# Patient Record
Sex: Male | Born: 1940 | Race: Black or African American | Hispanic: No | Marital: Married | Smoking: Former smoker
Health system: Southern US, Community
[De-identification: ages and names within clinical notes are randomized; demographics above are authoritative.]

## PROBLEM LIST (undated history)

## (undated) DIAGNOSIS — E7800 Pure hypercholesterolemia, unspecified: Secondary | ICD-10-CM

## (undated) DIAGNOSIS — I10 Essential (primary) hypertension: Secondary | ICD-10-CM

## (undated) HISTORY — PX: CHOLECYSTECTOMY: SHX55

---

## 2010-01-07 ENCOUNTER — Emergency Department (HOSPITAL_BASED_OUTPATIENT_CLINIC_OR_DEPARTMENT_OTHER): Admission: EM | Admit: 2010-01-07 | Discharge: 2010-01-07 | Payer: Self-pay | Admitting: Emergency Medicine

## 2010-05-02 ENCOUNTER — Emergency Department (HOSPITAL_BASED_OUTPATIENT_CLINIC_OR_DEPARTMENT_OTHER): Admission: EM | Admit: 2010-05-02 | Discharge: 2010-05-02 | Payer: Self-pay | Admitting: Emergency Medicine

## 2010-09-21 LAB — URINALYSIS, ROUTINE W REFLEX MICROSCOPIC
Bilirubin Urine: NEGATIVE
Hgb urine dipstick: NEGATIVE
Ketones, ur: NEGATIVE mg/dL
Specific Gravity, Urine: 1.011 (ref 1.005–1.030)
pH: 7 (ref 5.0–8.0)

## 2010-09-21 LAB — BASIC METABOLIC PANEL
BUN: 19 mg/dL (ref 6–23)
CO2: 29 mEq/L (ref 19–32)
GFR calc non Af Amer: 60 mL/min — ABNORMAL LOW (ref >60)
Glucose, Bld: 108 mg/dL — ABNORMAL HIGH (ref 70–99)
Potassium: 3.8 mEq/L (ref 3.5–5.1)
Sodium: 142 mEq/L (ref 135–145)

## 2010-09-25 LAB — BASIC METABOLIC PANEL
BUN: 20 mg/dL (ref 6–23)
CO2: 24 mEq/L (ref 19–32)
Chloride: 107 mEq/L (ref 96–112)
Glucose, Bld: 125 mg/dL — ABNORMAL HIGH (ref 70–99)
Potassium: 3.4 mEq/L — ABNORMAL LOW (ref 3.5–5.1)
Sodium: 139 mEq/L (ref 135–145)

## 2017-07-06 ENCOUNTER — Other Ambulatory Visit: Payer: Self-pay

## 2017-07-06 ENCOUNTER — Emergency Department (HOSPITAL_BASED_OUTPATIENT_CLINIC_OR_DEPARTMENT_OTHER)
Admission: EM | Admit: 2017-07-06 | Discharge: 2017-07-06 | Disposition: A | Discharge status: Home / Self Care | Payer: Medicare Other | Attending: Emergency Medicine | Admitting: Emergency Medicine

## 2017-07-06 ENCOUNTER — Encounter (HOSPITAL_BASED_OUTPATIENT_CLINIC_OR_DEPARTMENT_OTHER): Payer: Self-pay | Admitting: *Deleted

## 2017-07-06 ENCOUNTER — Emergency Department (HOSPITAL_BASED_OUTPATIENT_CLINIC_OR_DEPARTMENT_OTHER): Payer: Medicare Other

## 2017-07-06 DIAGNOSIS — K2900 Acute gastritis without bleeding: Secondary | ICD-10-CM | POA: Insufficient documentation

## 2017-07-06 DIAGNOSIS — R1033 Periumbilical pain: Secondary | ICD-10-CM | POA: Insufficient documentation

## 2017-07-06 DIAGNOSIS — R1013 Epigastric pain: Secondary | ICD-10-CM | POA: Diagnosis present

## 2017-07-06 DIAGNOSIS — Z87891 Personal history of nicotine dependence: Secondary | ICD-10-CM | POA: Diagnosis not present

## 2017-07-06 DIAGNOSIS — J069 Acute upper respiratory infection, unspecified: Secondary | ICD-10-CM

## 2017-07-06 DIAGNOSIS — I10 Essential (primary) hypertension: Secondary | ICD-10-CM | POA: Insufficient documentation

## 2017-07-06 HISTORY — DX: Pure hypercholesterolemia, unspecified: E78.00

## 2017-07-06 HISTORY — DX: Essential (primary) hypertension: I10

## 2017-07-06 LAB — CBC WITH DIFFERENTIAL/PLATELET
Basophils Absolute: 0 10*3/uL (ref 0.0–0.1)
Basophils Relative: 0 %
Eosinophils Absolute: 0 10*3/uL (ref 0.0–0.7)
Eosinophils Relative: 1 %
HEMATOCRIT: 37.5 % — AB (ref 39.0–52.0)
HEMOGLOBIN: 11.8 g/dL — AB (ref 13.0–17.0)
LYMPHS ABS: 1.1 10*3/uL (ref 0.7–4.0)
Lymphocytes Relative: 17 %
MCH: 26.1 pg (ref 26.0–34.0)
MCHC: 31.5 g/dL (ref 30.0–36.0)
MCV: 83 fL (ref 78.0–100.0)
MONOS PCT: 7 %
Monocytes Absolute: 0.4 10*3/uL (ref 0.1–1.0)
NEUTROS ABS: 4.9 10*3/uL (ref 1.7–7.7)
NEUTROS PCT: 75 %
Platelets: 168 10*3/uL (ref 150–400)
RBC: 4.52 MIL/uL (ref 4.22–5.81)
RDW: 15.7 % — ABNORMAL HIGH (ref 11.5–15.5)
WBC: 6.5 10*3/uL (ref 4.0–10.5)

## 2017-07-06 LAB — COMPREHENSIVE METABOLIC PANEL
ALBUMIN: 4.2 g/dL (ref 3.5–5.0)
ALT: 24 U/L (ref 17–63)
ANION GAP: 12 (ref 5–15)
AST: 28 U/L (ref 15–41)
Alkaline Phosphatase: 97 U/L (ref 38–126)
BUN: 19 mg/dL (ref 6–20)
CHLORIDE: 105 mmol/L (ref 101–111)
CO2: 24 mmol/L (ref 22–32)
CREATININE: 1.36 mg/dL — AB (ref 0.61–1.24)
Calcium: 9.1 mg/dL (ref 8.9–10.3)
GFR calc non Af Amer: 49 mL/min — ABNORMAL LOW (ref >60)
GFR, EST AFRICAN AMERICAN: 56 mL/min — AB (ref >60)
GLUCOSE: 103 mg/dL — AB (ref 65–99)
Potassium: 4.4 mmol/L (ref 3.5–5.1)
SODIUM: 141 mmol/L (ref 135–145)
Total Bilirubin: 1 mg/dL (ref 0.3–1.2)
Total Protein: 7.5 g/dL (ref 6.5–8.1)

## 2017-07-06 LAB — URINALYSIS, ROUTINE W REFLEX MICROSCOPIC
BILIRUBIN URINE: NEGATIVE
GLUCOSE, UA: NEGATIVE mg/dL
Hgb urine dipstick: NEGATIVE
Ketones, ur: NEGATIVE mg/dL
Leukocytes, UA: NEGATIVE
NITRITE: NEGATIVE
PH: 8 (ref 5.0–8.0)
Protein, ur: 30 mg/dL — AB
SPECIFIC GRAVITY, URINE: 1.015 (ref 1.005–1.030)

## 2017-07-06 LAB — LIPASE, BLOOD: LIPASE: 37 U/L (ref 11–51)

## 2017-07-06 LAB — URINALYSIS, MICROSCOPIC (REFLEX): RBC / HPF: NONE SEEN RBC/hpf (ref 0–5)

## 2017-07-06 LAB — TROPONIN I: Troponin I: <0.03 ng/mL (ref <0.03)

## 2017-07-06 MED ORDER — ONDANSETRON 4 MG PO TBDP
4.0000 mg | ORAL_TABLET | Freq: Once | ORAL | Status: AC
  Administered 2017-07-06: 4 mg via ORAL
  Filled 2017-07-06: qty 1

## 2017-07-06 MED ORDER — GI COCKTAIL ~~LOC~~
30.0000 mL | Freq: Once | ORAL | Status: AC
  Administered 2017-07-06: 30 mL via ORAL
  Filled 2017-07-06: qty 30

## 2017-07-06 MED ORDER — ONDANSETRON 4 MG PO TBDP: 4.0000 mg | ORAL_TABLET | Freq: Three times a day (TID) | ORAL | 0 refills | Status: DC | PRN

## 2017-07-06 NOTE — ED Triage Notes (Signed)
Abdominal pain since yesterday with radiation into the middle of his lower back.  Hx of sinusitis for 3 days.

## 2017-07-06 NOTE — ED Notes (Signed)
ED Provider at bedside. 

## 2017-07-06 NOTE — Discharge Instructions (Signed)
Recommend taking antacid medicine daily (pantoprazole or other of your choice for 7-10 days)

## 2017-07-06 NOTE — ED Provider Notes (Signed)
[facility] EMERGENCY DEPARTMENT Provider Note   CSN: [id number] Arrival date & time: 07/06/17  1310     History   Chief Complaint Chief Complaint  Patient presents with  . Abdominal Pain    HPI Bradley Zavala is a 76 y.o. male.  HPI  Yesterday afternoon around 4PM had abdominal pain and that increased through to this afternoon Had sinus congestion and sore throat prior, but gradually yesterday developed pain which worsened throughout the day and overnight.   Epigastric pain and had gradual onset of back pain. Back pain was not as severe as pain in abdomen.  Pain feels like a "real pain", not cramping. Was sharp pain.  9/10, stayed right in the middle of his abdomen/epigastrium.  Now pain is 8/10.   Earlier had nausea but no vomiting. No diarrhea or constipation.  No urinary symptoms. No fevers. Tried spray for sore throat.    Past Medical History:  Diagnosis Date  . High cholesterol   . Hypertension     There are no active problems to display for this patient.   Past Surgical History:  Procedure Laterality Date  . CHOLECYSTECTOMY         Home Medications    Prior to Admission medications   Medication Sig Start Date End Date Taking? Authorizing Provider  AMLODIPINE BESYLATE PO Take by mouth.   Yes [provider]  ATORVASTATIN CALCIUM PO Take by mouth.   Yes [provider]  METOPROLOL SUCCINATE PO Take by mouth.   Yes [provider]  ondansetron (ZOFRAN ODT) 4 MG disintegrating tablet Take 1 tablet (4 mg total) by mouth every 8 (eight) hours as needed for nausea or vomiting. 07/06/17   Alvira MondaySchlossman, Goldie Tregoning, MD    Family History No family history on file.  Social History Social History   Tobacco Use  . Smoking status: Former Games developermoker  . Smokeless tobacco: Never Used  Substance Use Topics  . Alcohol use: No    Frequency: Never  . Drug use: No     Allergies   Patient has no known allergies.   Review of  Systems Review of Systems  Constitutional: Negative for fever.  HENT: Negative for sore throat.   Eyes: Negative for visual disturbance.  Respiratory: Negative for shortness of breath.   Cardiovascular: Negative for chest pain.  Gastrointestinal: Positive for abdominal pain and nausea. Negative for constipation, diarrhea and vomiting.  Genitourinary: Negative for difficulty urinating and dysuria.  Musculoskeletal: Negative for back pain and neck stiffness.  Skin: Negative for rash.  Neurological: Negative for syncope and headaches.     Physical Exam Updated Vital Signs BP 140/81 (BP Location: Right Arm)   Pulse 85   Temp 99.5 F (37.5 C) (Oral)   Resp 18   Ht 5\' 8"  (1.727 m)   Wt 68 kg (150 lb)   SpO2 97%   BMI 22.81 kg/m   Physical Exam  Constitutional: He is oriented to person, place, and time. He appears well-developed and well-nourished. No distress.  HENT:  Head: Normocephalic and atraumatic.  Eyes: Conjunctivae and EOM are normal.  Neck: Normal range of motion.  Cardiovascular: Normal rate, regular rhythm, normal heart sounds and intact distal pulses. Exam reveals no gallop and no friction rub.  No murmur heard. Pulmonary/Chest: Effort normal and breath sounds normal. No respiratory distress. He has no wheezes. He has no rales.  Abdominal: Soft. He exhibits no distension. There is tenderness (mild epigastric). There is no guarding, no CVA tenderness  and negative Murphy's sign.  Musculoskeletal: He exhibits no edema.  Neurological: He is alert and oriented to person, place, and time.  Skin: Skin is warm and dry. He is not diaphoretic.  Nursing note and vitals reviewed.    ED Treatments / Results  Labs (all labs ordered are listed, but only abnormal results are displayed) Labs Reviewed  URINALYSIS, ROUTINE W REFLEX MICROSCOPIC - Abnormal; Notable for the following components:      Result Value   Protein, ur 30 (*)    All other components within normal limits    URINALYSIS, MICROSCOPIC (REFLEX) - Abnormal; Notable for the following components:   Bacteria, UA RARE (*)    Squamous Epithelial / LPF 0-5 (*)    All other components within normal limits  COMPREHENSIVE METABOLIC PANEL - Abnormal; Notable for the following components:   Glucose, Bld 103 (*)    Creatinine, Ser 1.36 (*)    GFR calc non Af Amer 49 (*)    GFR calc Af Amer 56 (*)    All other components within normal limits  CBC WITH DIFFERENTIAL/PLATELET - Abnormal; Notable for the following components:   Hemoglobin 11.8 (*)    HCT 37.5 (*)    RDW 15.7 (*)    All other components within normal limits  LIPASE, BLOOD  TROPONIN I    EKG  EKG Interpretation  Date/Time:  Friday July 06 2017 17:45:50 EST Ventricular Rate:  80 PR Interval:    QRS Duration: 91 QT Interval:  396 QTC Calculation: 457 R Axis:   -31 Text Interpretation:  Sinus rhythm Abnormal R-wave progression, late transition Left ventricular hypertrophy No significant change since last tracing Confirmed by Alvira Monday ([id number]) on 07/06/2017 6:33:37 PM       Radiology Dg Chest 2 View  Result Date: 07/06/2017 CLINICAL DATA:  Cough with epigastric pain EXAM: CHEST  2 VIEW COMPARISON:  07/15/2012 FINDINGS: The heart size and mediastinal contours are within normal limits. Both lungs are clear. The visualized skeletal structures are unremarkable. Surgical clips in the right upper quadrant. IMPRESSION: No active cardiopulmonary disease. Electronically Signed   By: Jasmine Pang M.D.   On: 07/06/2017 18:09    Procedures [place] bedside Date/Time: 07/07/2017 2:06 AM Performed by: Alvira Monday, MD Authorized by: Alvira Monday, MD  Consent: Verbal consent obtained. Time out: Immediately prior to procedure a "time out" was called to verify the correct patient, procedure, equipment, support staff and site/side marked as required. Preparation: Patient was prepped and draped in the usual sterile fashion. Local  anesthesia used: no  Anesthesia: Local anesthesia used: no  Sedation: Patient sedated: no  Patient tolerance: Patient tolerated the procedure well with no immediate complications Comments: EMERGENCY DEPARTMENT ULTRASOUND  Study: Limited Retroperitoneal Ultrasound of the Abdominal Aorta.  INDICATIONS: abd pain Multiple views of the abdominal aorta were obtained in real-time from the diaphragmatic hiatus to the aortic bifurcation in transverse planes with a multi-frequency probe.  PERFORMED BY: Myself IMAGES ARCHIVED?: Yes LIMITATIONS:  Bowel gas INTERPRETATION:  No abdominal aortic aneurysm      (including critical care time)  Medications Ordered in ED Medications  ondansetron (ZOFRAN-ODT) disintegrating tablet 4 mg (4 mg Oral Given 07/06/17 1739)  gi cocktail (Maalox,Lidocaine,Donnatal) (30 mLs Oral Given 07/06/17 1739)     Initial Impression / Assessment and Plan / ED Course  I have reviewed the triage vital signs and the nursing notes.  Pertinent labs & imaging results that were available during my care of the patient were  reviewed by me and considered in my medical decision making (see chart for details).     76 year old male with a history of hypertension and hyperlipidemia presents with concern for epigastric abdominal pain.    Patient with stable vital signs after time of observation in the emergency department, bedside ultrasound shows no sign of AAA.  Have low suspicion that symptoms represent AAA.  He has had a history of cholecystectomy, does not have transaminitis, right upper quadrant tenderness to suggest other biliary obstruction.  No sign of pancreatitis on labs.  No sign of urinary tract infection.  History, physical exam, and lab work are not consistent with small bowel obstruction, appendicitis, diverticulitis or nephrolithiasis.  Reports cough, congestion and sore throat.  Chest x-ray shows no sign of pneumonia.  Given epigastric location of pain, EKG,  troponin were done which was showed no significant acute findings.  Suspect patient was likely with a viral URI, with epigastric abdominal pain secondary to gastritis.  His symptoms improved following Zofran and GI cocktail.  Given prescription for Zofran, recommend PPI. Patient discharged in stable condition with understanding of reasons to return.   Final Clinical Impressions(s) / ED Diagnoses   Final diagnoses:  Epigastric pain  Acute gastritis without hemorrhage, unspecified gastritis type  Upper respiratory tract infection, unspecified type    ED Discharge Orders        Ordered    ondansetron (ZOFRAN ODT) 4 MG disintegrating tablet  Every 8 hours PRN     07/06/17 1924       Alvira MondaySchlossman, Damarcus Reggio, MD 07/07/17 [phone]

## 2017-07-06 NOTE — ED Notes (Signed)
Pt verbalizes understanding of d/c instructions and denies any further need at this time. 

## 2017-07-06 NOTE — ED Notes (Signed)
Pt on monitor 

## 2018-11-02 IMAGING — CR DG CHEST 2V
2 series · 2 of 2 positions shown · non-contrast
Comparison: 07/15/2012

CLINICAL DATA: Cough with epigastric pain

EXAM:
CHEST  2 VIEW

[w chest pa]
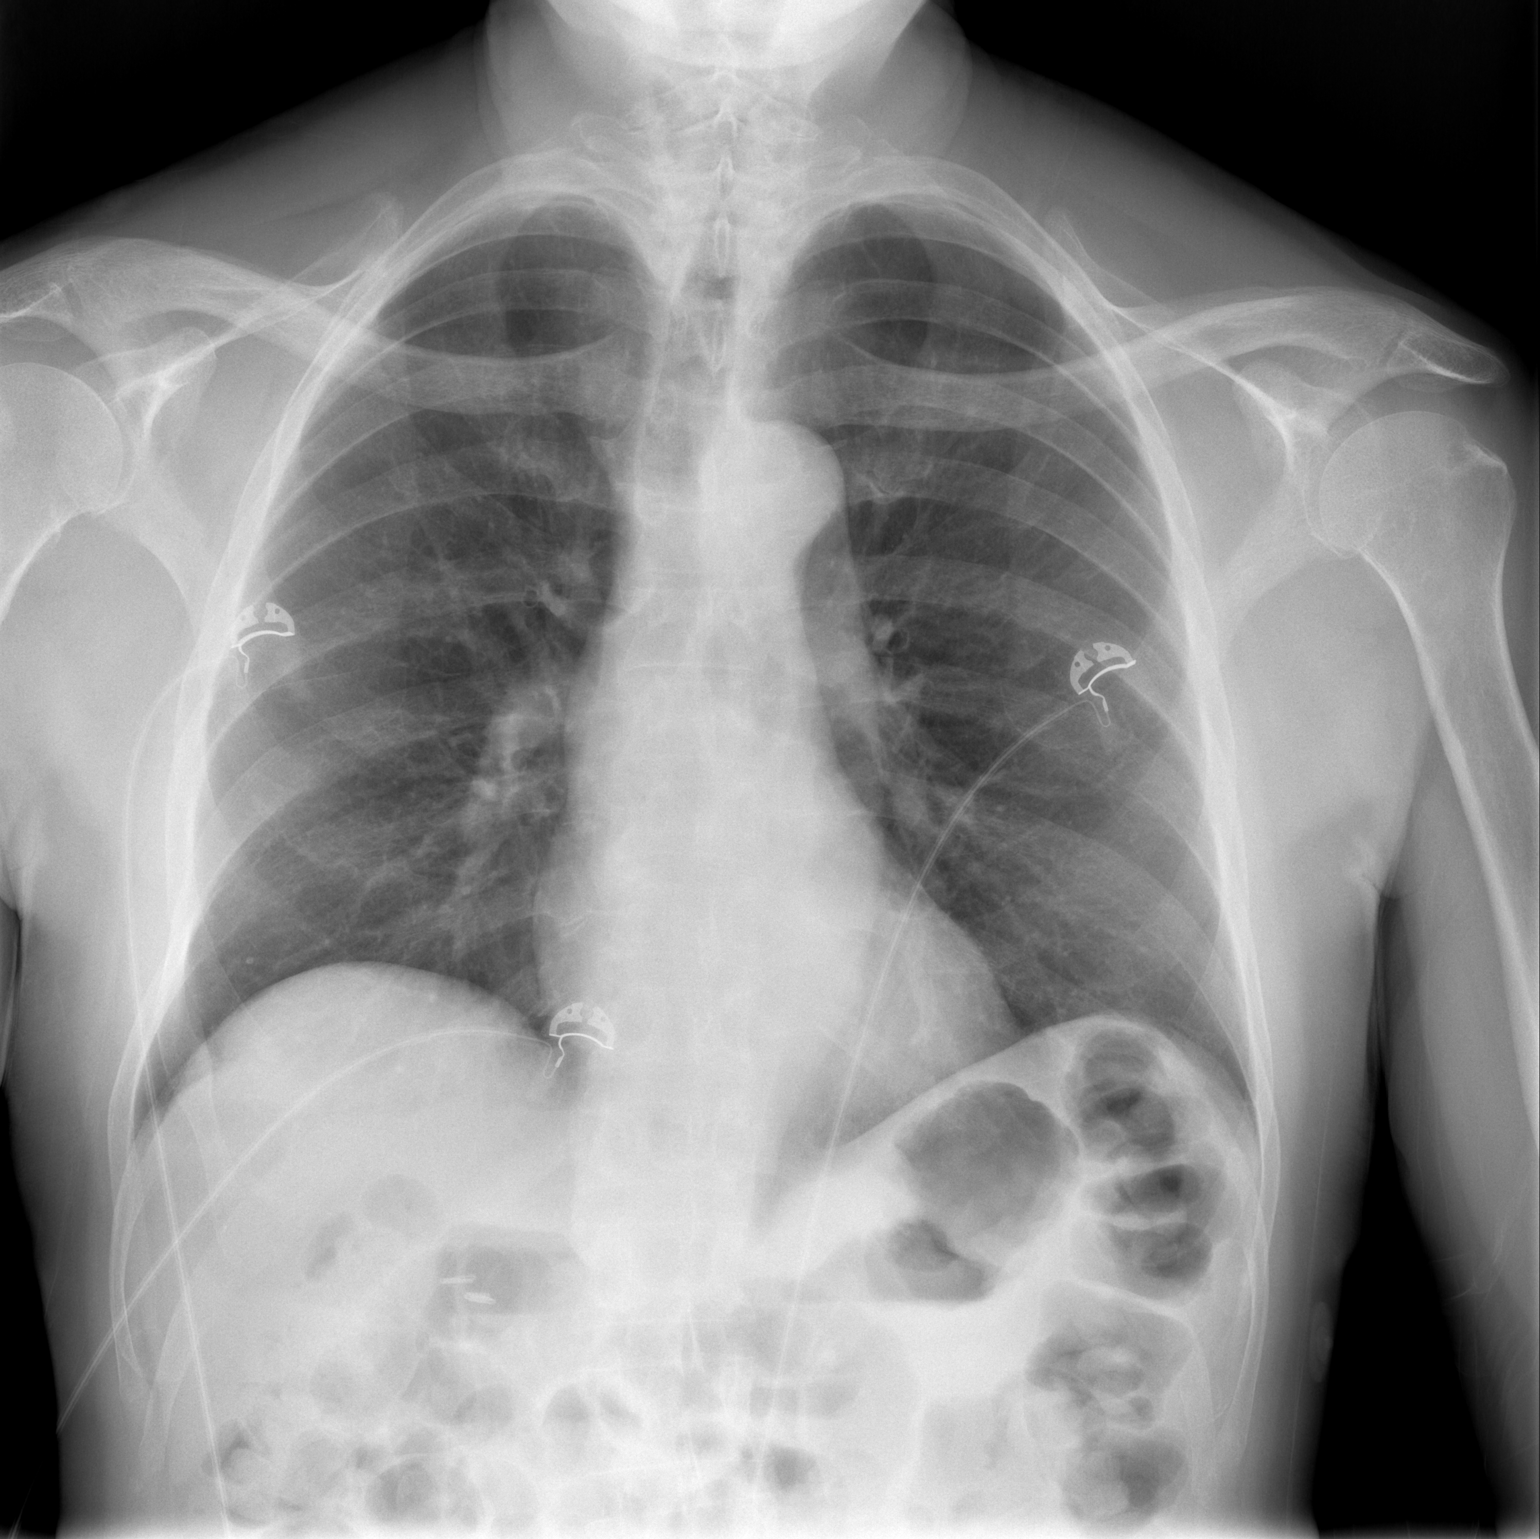

[w chest lat]
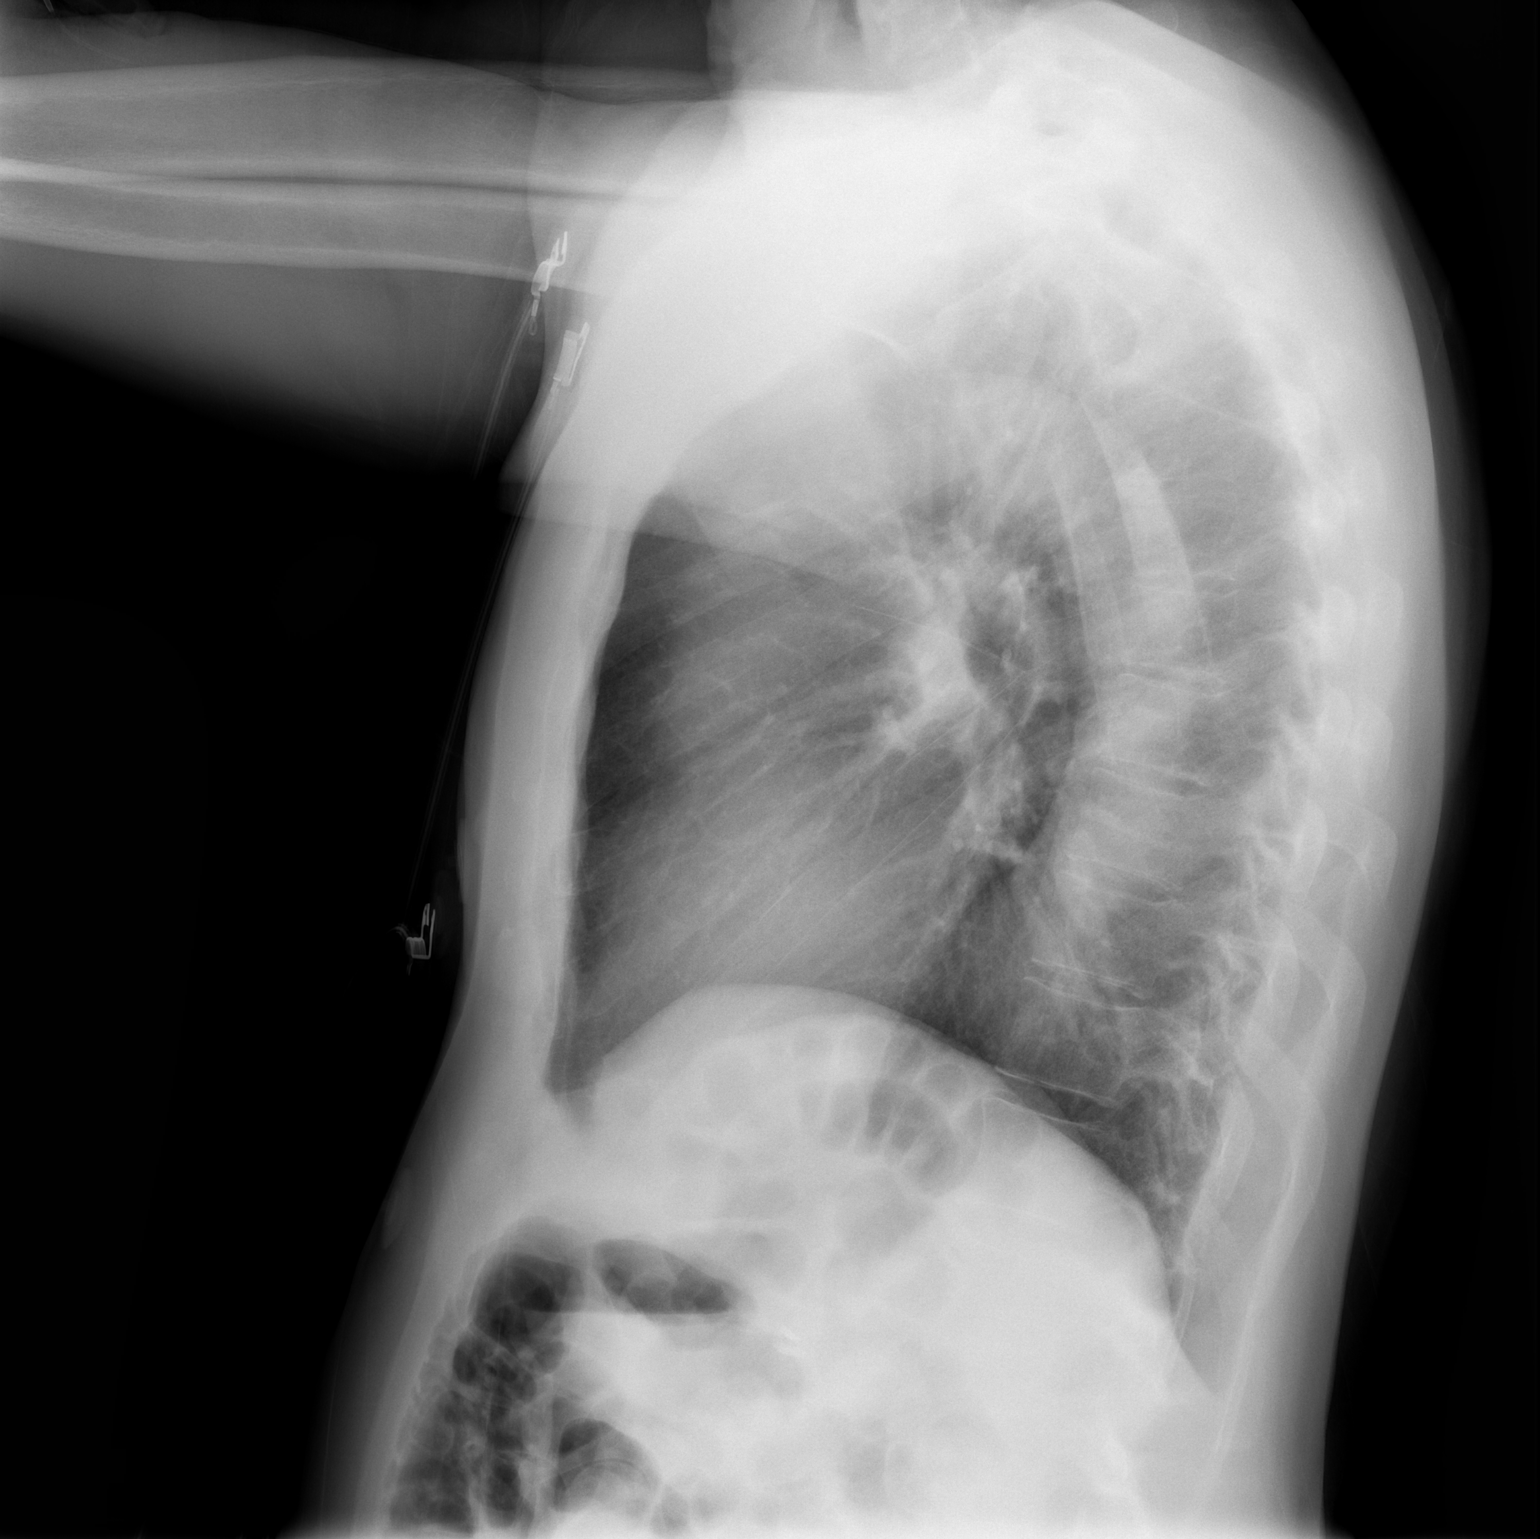

[2 of 2 positions shown; findings below may reference images not displayed]

FINDINGS: The heart size and mediastinal contours are within normal limits.
Both lungs are clear. The visualized skeletal structures are
unremarkable. Surgical clips in the right upper quadrant.
IMPRESSION: No active cardiopulmonary disease.

## 2020-01-12 ENCOUNTER — Encounter (HOSPITAL_BASED_OUTPATIENT_CLINIC_OR_DEPARTMENT_OTHER): Payer: Self-pay

## 2020-01-12 ENCOUNTER — Emergency Department (HOSPITAL_BASED_OUTPATIENT_CLINIC_OR_DEPARTMENT_OTHER): Payer: Medicare Other

## 2020-01-12 ENCOUNTER — Observation Stay (HOSPITAL_BASED_OUTPATIENT_CLINIC_OR_DEPARTMENT_OTHER)
Admission: EM | Admit: 2020-01-12 | Discharge: 2020-01-14 | Disposition: A | Discharge status: Home / Self Care | Payer: Medicare Other | Attending: Internal Medicine | Admitting: Internal Medicine

## 2020-01-12 ENCOUNTER — Other Ambulatory Visit: Payer: Self-pay

## 2020-01-12 DIAGNOSIS — I160 Hypertensive urgency: Secondary | ICD-10-CM

## 2020-01-12 DIAGNOSIS — K5730 Diverticulosis of large intestine without perforation or abscess without bleeding: Secondary | ICD-10-CM | POA: Insufficient documentation

## 2020-01-12 DIAGNOSIS — R1110 Vomiting, unspecified: Secondary | ICD-10-CM | POA: Diagnosis present

## 2020-01-12 DIAGNOSIS — Z79899 Other long term (current) drug therapy: Secondary | ICD-10-CM | POA: Diagnosis not present

## 2020-01-12 DIAGNOSIS — I700 Atherosclerosis of aorta: Secondary | ICD-10-CM | POA: Diagnosis not present

## 2020-01-12 DIAGNOSIS — Z20822 Contact with and (suspected) exposure to covid-19: Secondary | ICD-10-CM | POA: Insufficient documentation

## 2020-01-12 DIAGNOSIS — Z8249 Family history of ischemic heart disease and other diseases of the circulatory system: Secondary | ICD-10-CM | POA: Diagnosis not present

## 2020-01-12 DIAGNOSIS — E7800 Pure hypercholesterolemia, unspecified: Secondary | ICD-10-CM | POA: Diagnosis not present

## 2020-01-12 DIAGNOSIS — I129 Hypertensive chronic kidney disease with stage 1 through stage 4 chronic kidney disease, or unspecified chronic kidney disease: Secondary | ICD-10-CM | POA: Diagnosis not present

## 2020-01-12 DIAGNOSIS — Z9049 Acquired absence of other specified parts of digestive tract: Secondary | ICD-10-CM | POA: Insufficient documentation

## 2020-01-12 DIAGNOSIS — A041 Enterotoxigenic Escherichia coli infection: Secondary | ICD-10-CM | POA: Diagnosis not present

## 2020-01-12 DIAGNOSIS — Z87891 Personal history of nicotine dependence: Secondary | ICD-10-CM | POA: Diagnosis not present

## 2020-01-12 DIAGNOSIS — K529 Noninfective gastroenteritis and colitis, unspecified: Secondary | ICD-10-CM | POA: Diagnosis present

## 2020-01-12 DIAGNOSIS — A040 Enteropathogenic Escherichia coli infection: Secondary | ICD-10-CM | POA: Diagnosis not present

## 2020-01-12 DIAGNOSIS — N183 Chronic kidney disease, stage 3 unspecified: Secondary | ICD-10-CM | POA: Insufficient documentation

## 2020-01-12 DIAGNOSIS — N179 Acute kidney failure, unspecified: Secondary | ICD-10-CM | POA: Diagnosis not present

## 2020-01-12 DIAGNOSIS — E785 Hyperlipidemia, unspecified: Secondary | ICD-10-CM | POA: Diagnosis not present

## 2020-01-12 DIAGNOSIS — K219 Gastro-esophageal reflux disease without esophagitis: Secondary | ICD-10-CM | POA: Insufficient documentation

## 2020-01-12 DIAGNOSIS — I10 Essential (primary) hypertension: Secondary | ICD-10-CM

## 2020-01-12 LAB — TROPONIN I (HIGH SENSITIVITY): Troponin I (High Sensitivity): 4 ng/L (ref <18)

## 2020-01-12 LAB — CBC WITH DIFFERENTIAL/PLATELET
Abs Immature Granulocytes: 0.03 10*3/uL (ref 0.00–0.07)
Basophils Absolute: 0 10*3/uL (ref 0.0–0.1)
Basophils Relative: 0 %
Eosinophils Absolute: 0 10*3/uL (ref 0.0–0.5)
Eosinophils Relative: 0 %
HCT: 38.9 % — ABNORMAL LOW (ref 39.0–52.0)
Hemoglobin: 12.1 g/dL — ABNORMAL LOW (ref 13.0–17.0)
Immature Granulocytes: 0 %
Lymphocytes Relative: 7 %
Lymphs Abs: 0.8 10*3/uL (ref 0.7–4.0)
MCH: 26.9 pg (ref 26.0–34.0)
MCHC: 31.1 g/dL (ref 30.0–36.0)
MCV: 86.6 fL (ref 80.0–100.0)
Monocytes Absolute: 0.5 10*3/uL (ref 0.1–1.0)
Monocytes Relative: 5 %
Neutro Abs: 9.2 10*3/uL — ABNORMAL HIGH (ref 1.7–7.7)
Neutrophils Relative %: 88 %
Platelets: 185 10*3/uL (ref 150–400)
RBC: 4.49 MIL/uL (ref 4.22–5.81)
RDW: 15.2 % (ref 11.5–15.5)
WBC: 10.6 10*3/uL — ABNORMAL HIGH (ref 4.0–10.5)
nRBC: 0 % (ref 0.0–0.2)

## 2020-01-12 LAB — COMPREHENSIVE METABOLIC PANEL
ALT: 23 U/L (ref 0–44)
AST: 28 U/L (ref 15–41)
Albumin: 4.5 g/dL (ref 3.5–5.0)
Alkaline Phosphatase: 100 U/L (ref 38–126)
Anion gap: 13 (ref 5–15)
BUN: 33 mg/dL — ABNORMAL HIGH (ref 8–23)
CO2: 23 mmol/L (ref 22–32)
Calcium: 8.9 mg/dL (ref 8.9–10.3)
Chloride: 104 mmol/L (ref 98–111)
Creatinine, Ser: 1.75 mg/dL — ABNORMAL HIGH (ref 0.61–1.24)
GFR calc Af Amer: 42 mL/min — ABNORMAL LOW (ref >60)
GFR calc non Af Amer: 36 mL/min — ABNORMAL LOW (ref >60)
Glucose, Bld: 112 mg/dL — ABNORMAL HIGH (ref 70–99)
Potassium: 4 mmol/L (ref 3.5–5.1)
Sodium: 140 mmol/L (ref 135–145)
Total Bilirubin: 0.9 mg/dL (ref 0.3–1.2)
Total Protein: 7.7 g/dL (ref 6.5–8.1)

## 2020-01-12 LAB — LIPASE, BLOOD: Lipase: 46 U/L (ref 11–51)

## 2020-01-12 MED ORDER — ONDANSETRON HCL 4 MG/2ML IJ SOLN
4.0000 mg | Freq: Once | INTRAMUSCULAR | Status: AC
  Administered 2020-01-12: 4 mg via INTRAVENOUS
  Filled 2020-01-12: qty 2

## 2020-01-12 MED ORDER — IOHEXOL 300 MG/ML  SOLN
100.0000 mL | Freq: Once | INTRAMUSCULAR | Status: AC | PRN
  Administered 2020-01-12: 75 mL via INTRAVENOUS

## 2020-01-12 NOTE — ED Triage Notes (Signed)
Pt c/o n/v started yesterday-denies pain-NAD-steady gait

## 2020-01-12 NOTE — ED Provider Notes (Signed)
  Provider Note MRN:  [id number]  Arrival date & time: 01/13/20    ED Course and Medical Decision Making  Assumed care from PA Caccavale at shift change.  Awaiting CT and second trop as well as reassess  CT revealing nonspecific enteritis.  Upon reassessment patient continues to be tachycardic despite fluid resuscitation, temperature rechecked and is febrile.  When patient is stood up his heart rate is 140.  There is concern for worsening of condition, I suspect a viral infection but will cover with antibiotics.  Admitted to hospital service for further care.  Procedures  Final Clinical Impressions(s) / ED Diagnoses     ICD-10-CM   1. Enteritis  K52.9     ED Discharge Orders    None      Discharge Instructions   None     Elmer Sow. Pilar Plate, MD [facility] Emergency [facility] [email or web address]    Sabas Sous, MD 01/13/20 [phone]

## 2020-01-12 NOTE — ED Provider Notes (Signed)
[facility] EMERGENCY DEPARTMENT Provider Note   CSN: [id number] Arrival date & time: 01/12/20  2003     History Chief Complaint  Patient presents with  . Vomiting    Bradley Zavala is a 79 y.o. male presenting for evaluation of nausea, vomiting, chest pain.  Patient states that the past 4 days, he has been feeling nauseous.  Today he had several episodes of emesis (5-6). Emesis is nonbloody and nonbilious . Patient states he is also been having chest pain, which has worsened since vomiting but was present prior to the nausea.  He states this reminds him of when he has had issues with his reflux.  He takes omeprazole daily.  He thinks he has had an EGD, but it has been many years.  He denies fevers, chills, shortness of breath, cough, urinary symptoms, abnormal bowel movements.  He reports a history of diverticulitis for which she had surgery several years ago at [facility].  He is not on blood thinners.  He reports a history of high blood pressure and GERD, but is unable to name any other medical history or which medicines he is taking.  Overall, pt is a poor historian, frequently contradicting himself.  HPI     Past Medical History:  Diagnosis Date  . High cholesterol   . Hypertension     There are no problems to display for this patient.   Past Surgical History:  Procedure Laterality Date  . CHOLECYSTECTOMY         No family history on file.  Social History   Tobacco Use  . Smoking status: Former Games developer  . Smokeless tobacco: Never Used  Vaping Use  . Vaping Use: Never used  Substance Use Topics  . Alcohol use: No  . Drug use: No    Home Medications Prior to Admission medications   Medication Sig Start Date End Date Taking? Authorizing Provider  AMLODIPINE BESYLATE PO Take by mouth.    [provider]  ATORVASTATIN CALCIUM PO Take by mouth.    [provider]  METOPROLOL SUCCINATE PO Take by mouth.    [provider]  ondansetron (ZOFRAN ODT) 4 MG disintegrating tablet Take 1 tablet (4 mg total) by mouth every 8 (eight) hours as needed for nausea or vomiting. 07/06/17   Alvira Monday, MD    Allergies    Patient has no known allergies.  Review of Systems   Review of Systems  Cardiovascular: Positive for chest pain.  Gastrointestinal: Positive for nausea and vomiting.  All other systems reviewed and are negative.   Physical Exam Updated Vital Signs BP (!) 164/85 (BP Location: Right Arm)   Pulse 81   Temp 98 F (36.7 C) (Oral)   Resp 16   Ht 5\' 6"  (1.676 m)   Wt 66 kg   SpO2 98%   BMI 23.50 kg/m   Physical Exam Vitals and nursing note reviewed.  Constitutional:      General: He is not in acute distress.    Appearance: He is well-developed.     Comments: Resting in the bed in no acute distress  HENT:     Head: Normocephalic and atraumatic.  Eyes:     Conjunctiva/sclera: Conjunctivae normal.     Pupils: Pupils are equal, round, and reactive to light.  Cardiovascular:     Rate and Rhythm: Normal rate and regular rhythm.     Pulses: Normal pulses.  Pulmonary:     Effort: Pulmonary effort is  normal. No respiratory distress.     Breath sounds: Normal breath sounds. No wheezing.  Abdominal:     General: There is no distension.     Palpations: Abdomen is soft. There is no mass.     Tenderness: There is no abdominal tenderness. There is no guarding or rebound.     Comments: No ttp of the abd  Musculoskeletal:        General: Normal range of motion.     Cervical back: Normal range of motion and neck supple.  Skin:    General: Skin is warm and dry.     Capillary Refill: Capillary refill takes less than 2 seconds.  Neurological:     Mental Status: He is alert and oriented to person, place, and time.     ED Results / Procedures / Treatments   Labs (all labs ordered are listed, but only abnormal results are displayed) Labs Reviewed - No data to  display  EKG None  Radiology No results found.  Procedures Procedures (including critical care time)  Medications Ordered in ED Medications - No data to display  ED Course  I have reviewed the triage vital signs and the nursing notes.  Pertinent labs & imaging results that were available during my care of the patient were reviewed by me and considered in my medical decision making (see chart for details).    MDM Rules/Calculators/A&P                          Presenting for evaluation of nausea, vomiting, chest pain.  On exam, patient appears nontoxic.  He has no abdominal tenderness.  However patient is overall very poor historian, ?early dementia.  In the setting of an elderly male with chest pain, nausea, vomiting, will obtain labs, chest x-ray, EKG.  Patient will likely need a CT scan.  CBC shows mild leukocytosis at 10.6, overall nonspecific.  CMP shows elevation in creatinine from baseline at 1.75, although baseline is ~1.2 (from 2 yrs ago).  Initial troponin negative.  Delta pending.  Acute abdomen and chest shows possible partial small bowel obstruction.  CT pending.  Case discussed with attending, Dr. Fredderick Phenix evaluated the patient.  Patient signed out to Randol Kern, MD for follow-up on CT.   Final Clinical Impression(s) / ED Diagnoses Final diagnoses:  None    Rx / DC Orders ED Discharge Orders    None       Alveria Apley, PA-C 01/13/20 0005    Rolan Bucco, MD 01/24/20 [phone]

## 2020-01-12 NOTE — ED Notes (Signed)
Pt to Xray.

## 2020-01-13 ENCOUNTER — Encounter (HOSPITAL_COMMUNITY): Payer: Self-pay | Admitting: Internal Medicine

## 2020-01-13 DIAGNOSIS — A040 Enteropathogenic Escherichia coli infection: Secondary | ICD-10-CM | POA: Diagnosis not present

## 2020-01-13 DIAGNOSIS — Z20822 Contact with and (suspected) exposure to covid-19: Secondary | ICD-10-CM | POA: Diagnosis not present

## 2020-01-13 DIAGNOSIS — N183 Chronic kidney disease, stage 3 unspecified: Secondary | ICD-10-CM | POA: Diagnosis not present

## 2020-01-13 DIAGNOSIS — Z8249 Family history of ischemic heart disease and other diseases of the circulatory system: Secondary | ICD-10-CM | POA: Diagnosis not present

## 2020-01-13 DIAGNOSIS — N179 Acute kidney failure, unspecified: Secondary | ICD-10-CM | POA: Diagnosis not present

## 2020-01-13 DIAGNOSIS — K529 Noninfective gastroenteritis and colitis, unspecified: Secondary | ICD-10-CM | POA: Diagnosis not present

## 2020-01-13 DIAGNOSIS — R1110 Vomiting, unspecified: Secondary | ICD-10-CM | POA: Diagnosis present

## 2020-01-13 DIAGNOSIS — I10 Essential (primary) hypertension: Secondary | ICD-10-CM

## 2020-01-13 DIAGNOSIS — A041 Enterotoxigenic Escherichia coli infection: Secondary | ICD-10-CM | POA: Diagnosis not present

## 2020-01-13 DIAGNOSIS — Z9049 Acquired absence of other specified parts of digestive tract: Secondary | ICD-10-CM | POA: Diagnosis not present

## 2020-01-13 DIAGNOSIS — Z79899 Other long term (current) drug therapy: Secondary | ICD-10-CM | POA: Diagnosis not present

## 2020-01-13 DIAGNOSIS — K219 Gastro-esophageal reflux disease without esophagitis: Secondary | ICD-10-CM | POA: Diagnosis not present

## 2020-01-13 DIAGNOSIS — I700 Atherosclerosis of aorta: Secondary | ICD-10-CM | POA: Diagnosis not present

## 2020-01-13 DIAGNOSIS — I129 Hypertensive chronic kidney disease with stage 1 through stage 4 chronic kidney disease, or unspecified chronic kidney disease: Secondary | ICD-10-CM | POA: Diagnosis not present

## 2020-01-13 DIAGNOSIS — K5730 Diverticulosis of large intestine without perforation or abscess without bleeding: Secondary | ICD-10-CM | POA: Diagnosis not present

## 2020-01-13 DIAGNOSIS — I160 Hypertensive urgency: Secondary | ICD-10-CM | POA: Diagnosis not present

## 2020-01-13 DIAGNOSIS — E7800 Pure hypercholesterolemia, unspecified: Secondary | ICD-10-CM | POA: Diagnosis not present

## 2020-01-13 DIAGNOSIS — E785 Hyperlipidemia, unspecified: Secondary | ICD-10-CM | POA: Diagnosis not present

## 2020-01-13 DIAGNOSIS — Z87891 Personal history of nicotine dependence: Secondary | ICD-10-CM | POA: Diagnosis not present

## 2020-01-13 LAB — URINALYSIS, MICROSCOPIC (REFLEX)

## 2020-01-13 LAB — URINALYSIS, ROUTINE W REFLEX MICROSCOPIC
Bilirubin Urine: NEGATIVE
Glucose, UA: NEGATIVE mg/dL
Ketones, ur: NEGATIVE mg/dL
Leukocytes,Ua: NEGATIVE
Nitrite: NEGATIVE
Protein, ur: NEGATIVE mg/dL
Specific Gravity, Urine: 1.01 (ref 1.005–1.030)
pH: 5.5 (ref 5.0–8.0)

## 2020-01-13 LAB — C DIFFICILE QUICK SCREEN W PCR REFLEX
C Diff antigen: NEGATIVE
C Diff interpretation: NOT DETECTED
C Diff toxin: NEGATIVE

## 2020-01-13 LAB — SARS CORONAVIRUS 2 BY RT PCR (HOSPITAL ORDER, PERFORMED IN ~~LOC~~ HOSPITAL LAB): SARS Coronavirus 2: NEGATIVE

## 2020-01-13 LAB — TROPONIN I (HIGH SENSITIVITY): Troponin I (High Sensitivity): 5 ng/L (ref <18)

## 2020-01-13 MED ORDER — METOPROLOL TARTRATE 50 MG PO TABS
50.0000 mg | ORAL_TABLET | Freq: Two times a day (BID) | ORAL | Status: DC
  Administered 2020-01-13 – 2020-01-14 (×3): 50 mg via ORAL
  Filled 2020-01-13 (×3): qty 1

## 2020-01-13 MED ORDER — HYDRALAZINE HCL 25 MG PO TABS: 25.0000 mg | ORAL_TABLET | ORAL | Status: DC | PRN

## 2020-01-13 MED ORDER — SODIUM CHLORIDE 0.9 % IV BOLUS
1000.0000 mL | Freq: Once | INTRAVENOUS | Status: AC
  Administered 2020-01-13: 1000 mL via INTRAVENOUS

## 2020-01-13 MED ORDER — AMLODIPINE BESYLATE 10 MG PO TABS
10.0000 mg | ORAL_TABLET | Freq: Every day | ORAL | Status: DC
  Administered 2020-01-13 – 2020-01-14 (×2): 10 mg via ORAL
  Filled 2020-01-13 (×2): qty 1

## 2020-01-13 MED ORDER — METRONIDAZOLE IN NACL 5-0.79 MG/ML-% IV SOLN
500.0000 mg | Freq: Once | INTRAVENOUS | Status: AC
  Administered 2020-01-13: 500 mg via INTRAVENOUS
  Filled 2020-01-13: qty 100

## 2020-01-13 MED ORDER — LABETALOL HCL 5 MG/ML IV SOLN: 10.0000 mg | INTRAVENOUS | Status: DC | PRN

## 2020-01-13 MED ORDER — ACETAMINOPHEN 325 MG PO TABS: 650.0000 mg | ORAL_TABLET | ORAL | Status: DC | PRN

## 2020-01-13 MED ORDER — ONDANSETRON HCL 4 MG/2ML IJ SOLN: 4.0000 mg | Freq: Four times a day (QID) | INTRAMUSCULAR | Status: DC | PRN

## 2020-01-13 MED ORDER — ACETAMINOPHEN 500 MG PO TABS
1000.0000 mg | ORAL_TABLET | Freq: Once | ORAL | Status: AC
  Administered 2020-01-13: 1000 mg via ORAL
  Filled 2020-01-13: qty 2

## 2020-01-13 MED ORDER — ATORVASTATIN CALCIUM 20 MG PO TABS
20.0000 mg | ORAL_TABLET | Freq: Every day | ORAL | Status: DC
  Administered 2020-01-13: 20 mg via ORAL
  Filled 2020-01-13: qty 1

## 2020-01-13 MED ORDER — SODIUM CHLORIDE 0.9 % IV SOLN
2.0000 g | Freq: Once | INTRAVENOUS | Status: AC
  Administered 2020-01-13: 2 g via INTRAVENOUS
  Filled 2020-01-13: qty 20

## 2020-01-13 MED ORDER — ENOXAPARIN SODIUM 30 MG/0.3ML ~~LOC~~ SOLN
30.0000 mg | SUBCUTANEOUS | Status: DC
  Administered 2020-01-13 – 2020-01-14 (×2): 30 mg via SUBCUTANEOUS
  Filled 2020-01-13 (×2): qty 0.3

## 2020-01-13 MED ORDER — LACTATED RINGERS IV SOLN: INTRAVENOUS | Status: DC

## 2020-01-13 NOTE — Hospital Course (Addendum)
Bradley Zavala is a 79 yo AA male with PMH hypertension, hyperlipidemia who presented to med [facility] with abdominal pain, nausea/vomiting and initially stating "chest pain". His recollection and ability to provide collateral information were difficult to obtain at times; as noted both personally as well as per provider at [facility] also.  With further questioning he localizes his "CP" to his epigastrium. Troponin was also checked and negative x 2. EKG was also negative for obvious ischemia.  He then underwent CT abd/pelvis which was notable for "possible mild enteritis" and diverticulosis.  Vitals notable for persistent tachycardia (90s-110s), elevated BP (160-200/80-100).  Due to his HTN and tachycardia he was recommended for observation and further workup if needed.   Upon interview and arrival to [facility] he is comfortably resting in bed in no distress. His last BM was yesterday and he has had no further diarrheal/soft stools and his abdominal pain is minimal. He further denies any CP as well.   His BP and HR were improved after initiation of IVF.  He had no further concerns after interview.   His diarrhea continued to improve during hospitalization.  His stool studies were negative for C. difficile however did come back positive for EPEC and ETEC.  He was recommended to continue maintaining an adequate diet at discharge with appropriate hydration.  If no improvement in the next 3 to 4 days, he was instructed to call back for antibiotic to be sent to his pharmacy.  This was also instructed to his wife who voiced understanding.  He otherwise was clinically improved and considered stable for discharge home.

## 2020-01-13 NOTE — Progress Notes (Addendum)
Patient arrived via [facility] from [facility]. Alert and oriented x 2 ( reoriented to place) . Admission vital signs documented and WNL. No C/O pain or nausea at this time. Made night shift on call attending aware of arrival. Informed AM attending will follow-up with orders. Will cont to monitor.

## 2020-01-13 NOTE — Progress Notes (Signed)
MD updated Pt having frequent watery stools. New orders placed by MD. Pt placed on Enteric precautions and explanation and teaching given to Pt

## 2020-01-13 NOTE — Assessment & Plan Note (Signed)
-   BP uncontrolled on initial assessment at [facility] - follow up med rec - use labetalol and/or hydralazine PRN for now - possible noncompliance at home

## 2020-01-13 NOTE — Assessment & Plan Note (Signed)
-   patient initially presented with N/V, abdominal pain; most of his symptoms appear to be improving and resolved this morning; CT reviewed; for now continue supportive care; if develops diarrhea, will send for GI pathogen panel and/or Cdiff - zofran PRN - tylenol PRN - continue IVF

## 2020-01-13 NOTE — Assessment & Plan Note (Signed)
-   initially tachycardic with uncontrolled BP; suspect either due to pain vs dehydration vs noncompliance - resume home amlodipine and lopressor - further titration as needed

## 2020-01-13 NOTE — Assessment & Plan Note (Signed)
-   baseline creat ~1.3; suspected pre-renal at this time  - continue on IVF and trend BMP

## 2020-01-13 NOTE — ED Notes (Signed)
ED Provider at bedside. 

## 2020-01-13 NOTE — H&P (Signed)
History and Physical    Bradley Zavala  YWV:[id number]  DOB: [birth year]  DOA: 01/12/2020  PCP: Karle Plumber, MD Patient coming from: home  Chief Complaint: abdominal pain  HPI:  Bradley Zavala is a 79 yo AA male with PMH hypertension, hyperlipidemia who presented to med [facility] with abdominal pain, nausea/vomiting and initially stating "chest pain". His recollection and ability to provide collateral information were difficult to obtain at times; as noted both personally as well as per provider at [facility] also.  With further questioning he localizes his "CP" to his epigastrium. Troponin was also checked and negative x 2. EKG was also negative for obvious ischemia.  He then underwent CT abd/pelvis which was notable for "possible mild enteritis" and diverticulosis.  Vitals notable for persistent tachycardia (90s-110s), elevated BP (160-200/80-100).  Due to his HTN and tachycardia he was recommended for observation and further workup if needed.   Upon interview and arrival to [facility] he is comfortably resting in bed in no distress. His las BM was yesterday and he has had no further diarrheal/soft stools and his abdominal pain is minimal. He further denies any CP as well.   His BP and HR were improved after initiation of IVF.  He had no further concerns after interview.    I have personally briefly reviewed patient's old medical records in [facility]  Assessment/Plan: Acute renal failure superimposed on stage 3 chronic kidney disease (HCC) - baseline creat ~1.3; suspected pre-renal at this time  - continue on IVF and trend BMP  Enteritis - patient initially presented with N/V, abdominal pain; most of his symptoms appear to be improving and resolved this morning; CT reviewed; for now continue supportive care; if develops diarrhea, will send for GI pathogen panel and/or Cdiff - zofran PRN - tylenol PRN - continue IVF  Hypertensive urgency - BP uncontrolled on initial  assessment at [facility] - follow up med rec - use labetalol and/or hydralazine PRN for now - possible noncompliance at home   Essential hypertension - initially tachycardic with uncontrolled BP; suspect either due to pain vs dehydration vs noncompliance - resume home amlodipine and lopressor - further titration as needed   Code Status: Full DVT Prophylaxis:enoxaparin (Lovenox) 40mg  SQ 2 hours prior to surgery then every day Anticipated disposition is to Home  History: Past Medical History:  Diagnosis Date  . High cholesterol   . Hypertension     Past Surgical History:  Procedure Laterality Date  . CHOLECYSTECTOMY       reports that he has quit smoking. He has never used smokeless tobacco. He reports that he does not drink alcohol and does not use drugs.  No Known Allergies  Family History  Problem Relation Age of Onset  . Heart disease Mother   . Lung cancer Father    Home Medications: Prior to Admission medications   Medication Sig Start Date End Date Taking? Authorizing Provider  amLODipine (NORVASC) 10 MG tablet Take 10 mg by mouth daily. 10/26/19  Yes [provider]  atorvastatin (LIPITOR) 20 MG tablet Take 20 mg by mouth at bedtime. 11/28/19  Yes [provider]  metoprolol tartrate (LOPRESSOR) 50 MG tablet Take 50 mg by mouth 2 (two) times daily. 09/16/19  Yes [provider]    Review of Systems:  Pertinent items noted in HPI and remainder of comprehensive ROS otherwise negative.  Physical Exam: Vitals:   01/13/20 0502 01/13/20 0503 01/13/20 0534 01/13/20 0628  BP: 118/61   119/64  Pulse:  (!) 108  96  Resp: (!) 25 18  18   Temp:   100.2 F (37.9 C) 98.8 F (37.1 C)  TempSrc:   Oral Oral  SpO2:  95%  97%  Weight:      Height:       General appearance: alert, cooperative and no distress Head: Normocephalic, without obvious abnormality Eyes: EOMI Lungs: clear to auscultation bilaterally Heart: regular rate and rhythm and S1, S2  normal Abdomen: BS present; nonspecific TTP which is mild; no R/G. Abdomen is soft and nondistended Extremities: no edema Skin: warm, dry, intact Neurologic: Grossly normal  Labs on Admission:  I have personally reviewed following labs and imaging studies Results for orders placed or performed during the hospital encounter of 01/12/20 (from the past 24 hour(s))  CBC with Differential     Status: Abnormal   Collection Time: 01/12/20 10:10 PM  Result Value Ref Range   WBC 10.6 (H) 4.0 - 10.5 K/uL   RBC 4.49 4.22 - 5.81 MIL/uL   Hemoglobin 12.1 (L) 13.0 - 17.0 g/dL   HCT 16.138.9 (L) 39 - 52 %   MCV 86.6 80.0 - 100.0 fL   MCH 26.9 26.0 - 34.0 pg   MCHC 31.1 30.0 - 36.0 g/dL   RDW 09.615.2 04.511.5 - 40.915.5 %   Platelets 185 150 - 400 K/uL   nRBC 0.0 0.0 - 0.2 %   Neutrophils Relative % 88 %   Neutro Abs 9.2 (H) 1.7 - 7.7 K/uL   Lymphocytes Relative 7 %   Lymphs Abs 0.8 0.7 - 4.0 K/uL   Monocytes Relative 5 %   Monocytes Absolute 0.5 0 - 1 K/uL   Eosinophils Relative 0 %   Eosinophils Absolute 0.0 0 - 0 K/uL   Basophils Relative 0 %   Basophils Absolute 0.0 0 - 0 K/uL   Immature Granulocytes 0 %   Abs Immature Granulocytes 0.03 0.00 - 0.07 K/uL  Troponin I (High Sensitivity)     Status: None   Collection Time: 01/12/20 10:10 PM  Result Value Ref Range   Troponin I (High Sensitivity) 4 <18 ng/L  Comprehensive metabolic panel     Status: Abnormal   Collection Time: 01/12/20 11:18 PM  Result Value Ref Range   Sodium 140 135 - 145 mmol/L   Potassium 4.0 3.5 - 5.1 mmol/L   Chloride 104 98 - 111 mmol/L   CO2 23 22 - 32 mmol/L   Glucose, Bld 112 (H) 70 - 99 mg/dL   BUN 33 (H) 8 - 23 mg/dL   Creatinine, Ser 8.111.75 (H) 0.61 - 1.24 mg/dL   Calcium 8.9 8.9 - 91.410.3 mg/dL   Total Protein 7.7 6.5 - 8.1 g/dL   Albumin 4.5 3.5 - 5.0 g/dL   AST 28 15 - 41 U/L   ALT 23 0 - 44 U/L   Alkaline Phosphatase 100 38 - 126 U/L   Total Bilirubin 0.9 0.3 - 1.2 mg/dL   GFR calc non Af Amer 36 (L) >60 mL/min    GFR calc Af Amer 42 (L) >60 mL/min   Anion gap 13 5 - 15  Lipase, blood     Status: None   Collection Time: 01/12/20 11:18 PM  Result Value Ref Range   Lipase 46 11 - 51 U/L  SARS Coronavirus 2 by RT PCR (hospital order, performed [facility] lab) Nasopharyngeal Nasopharyngeal Swab     Status: None   Collection Time: 01/12/20 11:32 PM  Specimen: Nasopharyngeal Swab  Result Value Ref Range   SARS Coronavirus 2 NEGATIVE NEGATIVE  Troponin I (High Sensitivity)     Status: None   Collection Time: 01/13/20 12:07 AM  Result Value Ref Range   Troponin I (High Sensitivity) 5 <18 ng/L  Urinalysis, Routine w reflex microscopic     Status: Abnormal   Collection Time: 01/13/20  3:27 AM  Result Value Ref Range   Color, Urine YELLOW YELLOW   APPearance CLEAR CLEAR   Specific Gravity, Urine 1.010 1.005 - 1.030   pH 5.5 5.0 - 8.0   Glucose, UA NEGATIVE NEGATIVE mg/dL   Hgb urine dipstick SMALL (A) NEGATIVE   Bilirubin Urine NEGATIVE NEGATIVE   Ketones, ur NEGATIVE NEGATIVE mg/dL   Protein, ur NEGATIVE NEGATIVE mg/dL   Nitrite NEGATIVE NEGATIVE   Leukocytes,Ua NEGATIVE NEGATIVE  Urinalysis, Microscopic (reflex)     Status: Abnormal   Collection Time: 01/13/20  3:27 AM  Result Value Ref Range   RBC / HPF 0-5 0 - 5 RBC/hpf   WBC, UA 0-5 0 - 5 WBC/hpf   Bacteria, UA FEW (A) NONE SEEN   Squamous Epithelial / LPF 0-5 0 - 5     Radiological Exams on Admission: CT ABDOMEN PELVIS W CONTRAST  Result Date: 01/12/2020 CLINICAL DATA:  79 year old male with nausea and vomiting. EXAM: CT ABDOMEN AND PELVIS WITH CONTRAST TECHNIQUE: Multidetector CT imaging of the abdomen and pelvis was performed using the standard protocol following bolus administration of intravenous contrast. CONTRAST:  28mL OMNIPAQUE IOHEXOL 300 MG/ML  SOLN COMPARISON:  CT abdomen pelvis dated 08/28/2015. FINDINGS: Lower chest: The visualized lung bases are clear. There is mild eventration of the right hemidiaphragm. No  intra-abdominal free air or free fluid. Hepatobiliary: Several subcentimeter hepatic hypodense lesions are too small to characterize. The liver is otherwise unremarkable. No intrahepatic biliary ductal dilatation. Cholecystectomy. Pancreas: Unremarkable. No pancreatic ductal dilatation or surrounding inflammatory changes. Spleen: Normal in size without focal abnormality. Adrenals/Urinary Tract: The adrenal glands are unremarkable. There is a 4 cm right renal upper pole cyst. Several subcentimeter bilateral renal hypodense foci are too small to characterize. There is no hydronephrosis on either side. There is symmetric enhancement and excretion of contrast by both kidneys. The visualized ureters and urinary bladder appear unremarkable. Stomach/Bowel: There is sigmoid diverticulosis without active inflammatory changes. There is loose stool throughout the colon compatible with diarrheal state. Correlation with clinical exam and stool cultures recommended. Several normal caliber fluid-filled loops of small bowel noted throughout the abdomen which may be physiologic or represent mild enteritis. There is no bowel obstruction the appendix is normal. Vascular/Lymphatic: Mild aortoiliac atherosclerotic disease. The IVC is unremarkable. No portal venous gas. There is no adenopathy. Reproductive: Prostate fiducial markers. Other: Small fat containing umbilical hernia. Musculoskeletal: Degenerative changes of the lower lumbar spine. No acute osseous pathology. IMPRESSION: 1. Diarrheal state with possible mild enteritis. Correlation with clinical exam and stool cultures recommended. No bowel obstruction. Normal appendix. 2. Sigmoid diverticulosis. 3. Aortic Atherosclerosis (ICD10-I70.0). Electronically Signed   By: Elgie Collard M.D.   On: 01/12/2020 23:56   DG Abdomen Acute W/Chest  Result Date: 01/12/2020 CLINICAL DATA:  Chest and abdominal pain with nausea and vomiting EXAM: DG ABDOMEN ACUTE W/ 1V CHEST COMPARISON:   07/06/2017 FINDINGS: Cardiac shadow is within normal limits. The lungs are well aerated bilaterally. No focal infiltrate or sizable effusion is seen. No bony abnormality is noted. Scattered large and small bowel gas is noted. Mild small bowel dilatation  is noted suggestive of partial small bowel obstruction. No free air is noted. No abnormal mass is noted. No bony abnormality is seen. IMPRESSION: Small-bowel dilatation suggestive of partial small bowel obstruction. Electronically Signed   By: Alcide Clever M.D.   On: 01/12/2020 22:42   CT ABDOMEN PELVIS W CONTRAST  Final Result    DG Abdomen Acute W/Chest  Final Result      Consults called:  n/a   EKG: Independently reviewed. Sinus tach   Lewie Chamber, MD [facility] Pager: Secure chat  If 7PM-7AM, please contact night-coverage [email or web address] Use universal [place] password for that web site. If you do not have the password, please call the hospital operator.  01/13/2020, 8:54 AM

## 2020-01-13 NOTE — Care Management Obs Status (Signed)
MEDICARE OBSERVATION STATUS NOTIFICATION   Patient Details  Name: Bradley Zavala MRN: [id number] Date of Birth: 11-22-1940   Medicare Observation Status Notification Given:  Yes    Elliot Gault, LCSW 01/13/2020, 4:16 PM

## 2020-01-14 DIAGNOSIS — K529 Noninfective gastroenteritis and colitis, unspecified: Secondary | ICD-10-CM | POA: Diagnosis not present

## 2020-01-14 DIAGNOSIS — N179 Acute kidney failure, unspecified: Secondary | ICD-10-CM | POA: Diagnosis not present

## 2020-01-14 DIAGNOSIS — N183 Chronic kidney disease, stage 3 unspecified: Secondary | ICD-10-CM | POA: Diagnosis not present

## 2020-01-14 LAB — BASIC METABOLIC PANEL
Anion gap: 8 (ref 5–15)
BUN: 19 mg/dL (ref 8–23)
CO2: 22 mmol/L (ref 22–32)
Calcium: 8.7 mg/dL — ABNORMAL LOW (ref 8.9–10.3)
Chloride: 110 mmol/L (ref 98–111)
Creatinine, Ser: 1.72 mg/dL — ABNORMAL HIGH (ref 0.61–1.24)
GFR calc Af Amer: 43 mL/min — ABNORMAL LOW (ref >60)
GFR calc non Af Amer: 37 mL/min — ABNORMAL LOW (ref >60)
Glucose, Bld: 79 mg/dL (ref 70–99)
Potassium: 3.5 mmol/L (ref 3.5–5.1)
Sodium: 140 mmol/L (ref 135–145)

## 2020-01-14 LAB — GASTROINTESTINAL PANEL BY PCR, STOOL (REPLACES STOOL CULTURE)

## 2020-01-14 LAB — CBC WITH DIFFERENTIAL/PLATELET
Abs Immature Granulocytes: 0.02 10*3/uL (ref 0.00–0.07)
Basophils Absolute: 0 10*3/uL (ref 0.0–0.1)
Basophils Relative: 0 %
Eosinophils Absolute: 0 10*3/uL (ref 0.0–0.5)
Eosinophils Relative: 0 %
HCT: 32.6 % — ABNORMAL LOW (ref 39.0–52.0)
Hemoglobin: 10.1 g/dL — ABNORMAL LOW (ref 13.0–17.0)
Immature Granulocytes: 1 %
Lymphocytes Relative: 24 %
Lymphs Abs: 1 10*3/uL (ref 0.7–4.0)
MCH: 26.7 pg (ref 26.0–34.0)
MCHC: 31 g/dL (ref 30.0–36.0)
MCV: 86.2 fL (ref 80.0–100.0)
Monocytes Absolute: 0.2 10*3/uL (ref 0.1–1.0)
Monocytes Relative: 6 %
Neutro Abs: 3 10*3/uL (ref 1.7–7.7)
Neutrophils Relative %: 69 %
Platelets: 135 10*3/uL — ABNORMAL LOW (ref 150–400)
RBC: 3.78 MIL/uL — ABNORMAL LOW (ref 4.22–5.81)
RDW: 15 % (ref 11.5–15.5)
WBC: 4.3 10*3/uL (ref 4.0–10.5)
nRBC: 0 % (ref 0.0–0.2)

## 2020-01-14 LAB — MAGNESIUM: Magnesium: 2 mg/dL (ref 1.7–2.4)

## 2020-01-14 MED ORDER — LOPERAMIDE HCL 2 MG PO CAPS: 2.0000 mg | ORAL_CAPSULE | ORAL | Status: DC | PRN

## 2020-01-14 NOTE — Progress Notes (Signed)
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 Frederick Peers  Orders Received/Actions taken: MD aware

## 2020-01-14 NOTE — Progress Notes (Signed)
Called and spoke with patient and his wife regarding results.  Instructed them that this should self resolve on its own.  If does not feel much better in 3 to 4 days or ongoing diarrhea or unable to keep solids/liquids down then to call the hospital for prescription to be sent to his pharmacy.  They voiced understanding.  He stated he was already feeling better upon returning home.  All questions answered.  Lewie Chamber, MD 01/14/2020 5:04 PM

## 2020-01-15 NOTE — Discharge Summary (Signed)
Physician Discharge Summary  Bradley Zavala KZS:[id number] DOB: Oct 10, 1940 DOA: 01/12/2020  PCP: Karle Plumber, MD  Admit date: 01/12/2020 Discharge date: 01/15/2020  Admitted From: home Disposition:  home Discharging physician: Lewie Chamber, MD  Recommendations for Outpatient Follow-up:  1. Repeat BMP 2. May need abx if worsening sxms   Patient discharged to home in Discharge Condition: stable CODE STATUS: Full Diet recommendation:  Diet Orders (From admission, onward)    Start     Ordered   01/14/20 0000  Diet general        01/14/20 1136          Hospital Course: Bradley Zavala is a 79 yo AA male with PMH hypertension, hyperlipidemia who presented to [facility] with abdominal pain, nausea/vomiting and initially stating "chest pain". His recollection and ability to provide collateral information were difficult to obtain at times; as noted both personally as well as per provider at [facility] also.  With further questioning he localizes his "CP" to his epigastrium. Troponin was also checked and negative x 2. EKG was also negative for obvious ischemia.  He then underwent CT abd/pelvis which was notable for "possible mild enteritis" and diverticulosis.  Vitals notable for persistent tachycardia (90s-110s), elevated BP (160-200/80-100).  Due to his HTN and tachycardia he was recommended for observation and further workup if needed.   Upon interview and arrival to [facility] he is comfortably resting in bed in no distress. His last BM was yesterday and he has had no further diarrheal/soft stools and his abdominal pain is minimal. He further denies any CP as well.   His BP and HR were improved after initiation of IVF.  He had no further concerns after interview.   His diarrhea continued to improve during hospitalization.  His stool studies were negative for C. difficile however did come back positive for EPEC and ETEC.  He was recommended to continue maintaining an adequate diet at  discharge with appropriate hydration.  If no improvement in the next 3 to 4 days, he was instructed to call back for antibiotic to be sent to his pharmacy.  This was also instructed to his wife who voiced understanding.  He otherwise was clinically improved and considered stable for discharge home.   Acute renal failure superimposed on stage 3 chronic kidney disease (HCC) - baseline creat ~1.3; suspected pre-renal at this time  - continue on IVF and trend BMP  Enteritis - patient initially presented with N/V, abdominal pain; most of his symptoms appear to be improving and resolved this morning; CT reviewed; for now continue supportive care; if develops diarrhea, will send for GI pathogen panel and/or Cdiff - zofran PRN - tylenol PRN - continue IVF  Hypertensive urgency - BP uncontrolled on initial assessment at [facility] - follow up med rec - use labetalol and/or hydralazine PRN for now - possible noncompliance at home   Essential hypertension - initially tachycardic with uncontrolled BP; suspect either due to pain vs dehydration vs noncompliance - resume home amlodipine and lopressor - further titration as needed    The patient's chronic medical conditions were treated accordingly per the patient's home medication regimen except as noted.  On day of discharge, patient was felt deemed stable for discharge. Patient/family member advised to call PCP or come back to ER if needed.   Discharge Diagnoses:   Principal Diagnosis: <principal problem not specified>  Active Hospital Problems   Diagnosis Date Noted  . Essential hypertension 01/13/2020    Resolved Hospital Problems  Diagnosis Date Noted Date Resolved  . Enteritis 01/13/2020 01/14/2020  . Acute renal failure superimposed on stage 3 chronic kidney disease (HCC) 01/13/2020 01/14/2020  . Hypertensive urgency 01/13/2020 01/14/2020    Discharge Instructions    Diet general   Complete by: As directed    Increase activity slowly    Complete by: As directed      Allergies as of 01/14/2020   No Known Allergies     Medication List    TAKE these medications   amLODipine 10 MG tablet Commonly known as: NORVASC Take 10 mg by mouth daily.   atorvastatin 20 MG tablet Commonly known as: LIPITOR Take 20 mg by mouth at bedtime.   metoprolol tartrate 50 MG tablet Commonly known as: LOPRESSOR Take 50 mg by mouth 2 (two) times daily.       No Known Allergies  Procedures/Studies: CT ABDOMEN PELVIS W CONTRAST  Result Date: 01/12/2020 CLINICAL DATA:  79 year old male with nausea and vomiting. EXAM: CT ABDOMEN AND PELVIS WITH CONTRAST TECHNIQUE: Multidetector CT imaging of the abdomen and pelvis was performed using the standard protocol following bolus administration of intravenous contrast. CONTRAST:  9mL OMNIPAQUE IOHEXOL 300 MG/ML  SOLN COMPARISON:  CT abdomen pelvis dated 08/28/2015. FINDINGS: Lower chest: The visualized lung bases are clear. There is mild eventration of the right hemidiaphragm. No intra-abdominal free air or free fluid. Hepatobiliary: Several subcentimeter hepatic hypodense lesions are too small to characterize. The liver is otherwise unremarkable. No intrahepatic biliary ductal dilatation. Cholecystectomy. Pancreas: Unremarkable. No pancreatic ductal dilatation or surrounding inflammatory changes. Spleen: Normal in size without focal abnormality. Adrenals/Urinary Tract: The adrenal glands are unremarkable. There is a 4 cm right renal upper pole cyst. Several subcentimeter bilateral renal hypodense foci are too small to characterize. There is no hydronephrosis on either side. There is symmetric enhancement and excretion of contrast by both kidneys. The visualized ureters and urinary bladder appear unremarkable. Stomach/Bowel: There is sigmoid diverticulosis without active inflammatory changes. There is loose stool throughout the colon compatible with diarrheal state. Correlation with clinical exam and stool  cultures recommended. Several normal caliber fluid-filled loops of small bowel noted throughout the abdomen which may be physiologic or represent mild enteritis. There is no bowel obstruction the appendix is normal. Vascular/Lymphatic: Mild aortoiliac atherosclerotic disease. The IVC is unremarkable. No portal venous gas. There is no adenopathy. Reproductive: Prostate fiducial markers. Other: Small fat containing umbilical hernia. Musculoskeletal: Degenerative changes of the lower lumbar spine. No acute osseous pathology. IMPRESSION: 1. Diarrheal state with possible mild enteritis. Correlation with clinical exam and stool cultures recommended. No bowel obstruction. Normal appendix. 2. Sigmoid diverticulosis. 3. Aortic Atherosclerosis (ICD10-I70.0). Electronically Signed   By: Elgie Collard M.D.   On: 01/12/2020 23:56   DG Abdomen Acute W/Chest  Result Date: 01/12/2020 CLINICAL DATA:  Chest and abdominal pain with nausea and vomiting EXAM: DG ABDOMEN ACUTE W/ 1V CHEST COMPARISON:  07/06/2017 FINDINGS: Cardiac shadow is within normal limits. The lungs are well aerated bilaterally. No focal infiltrate or sizable effusion is seen. No bony abnormality is noted. Scattered large and small bowel gas is noted. Mild small bowel dilatation is noted suggestive of partial small bowel obstruction. No free air is noted. No abnormal mass is noted. No bony abnormality is seen. IMPRESSION: Small-bowel dilatation suggestive of partial small bowel obstruction. Electronically Signed   By: Alcide Clever M.D.   On: 01/12/2020 22:42    Discharge Exam: BP 127/61 (BP Location: Left Arm)   Pulse 69  Temp 99.2 F (37.3 C) (Oral)   Resp 17   Ht  (1.676 m)   Wt 66 kg   SpO2 98%   BMI 23.50 kg/m  General appearance: alert, cooperative and no distress Head: Normocephalic, without obvious abnormality Eyes: EOMI Lungs: clear to auscultation bilaterally Heart: regular rate and rhythm and S1, S2 normal Abdomen: BS  present; no furtherTTP; no R/G. Abdomen is soft and nondistended Extremities: no edema Skin: warm, dry, intact Neurologic: Grossly normal  The results of significant diagnostics from this hospitalization (including imaging, microbiology, ancillary and laboratory) are listed below for reference.     Microbiology: Recent Results (from the past 240 hour(s))  SARS Coronavirus 2 by RT PCR (hospital order, performed in [facility] hospital lab) Nasopharyngeal Nasopharyngeal Swab     Status: None   Collection Time: 01/12/20 11:32 PM   Specimen: Nasopharyngeal Swab  Result Value Ref Range Status   SARS Coronavirus 2 NEGATIVE NEGATIVE Final    Comment: (NOTE) SARS-CoV-2 target nucleic acids are NOT DETECTED.  The SARS-CoV-2 RNA is generally detectable in upper and lower respiratory specimens during the acute phase of infection. The lowest concentration of SARS-CoV-2 viral copies this assay can detect is 250 copies / mL. A negative result does not preclude SARS-CoV-2 infection and should not be used as the sole basis for treatment or other patient management decisions.  A negative result may occur with improper specimen collection / handling, submission of specimen other than nasopharyngeal swab, presence of viral mutation(s) within the areas targeted by this assay, and inadequate number of viral copies (<250 copies / mL). A negative result must be combined with clinical observations, patient history, and epidemiological information.  Fact Sheet for Patients:   BoilerBrush.com.cy  Fact Sheet for Healthcare Providers: https://pope.com/  This test is not yet approved or  cleared by the [place] FDA and has been authorized for detection and/or diagnosis of SARS-CoV-2 by FDA under an Emergency Use Authorization (EUA).  This EUA will remain in effect (meaning this test can be used) for the duration of the COVID-19 declaration under Section  564(b)(1) of the Act, 21 U.S.C. section 360bbb-3(b)(1), unless the authorization is terminated or revoked sooner.  Performed at [facility], [address] [place], [place] [address]   Gastrointestinal Panel by PCR , Stool     Status: Abnormal   Collection Time: 01/13/20  2:47 PM   Specimen: STOOL  Result Value Ref Range Status   Campylobacter species NOT DETECTED NOT DETECTED Final   Plesimonas shigelloides NOT DETECTED NOT DETECTED Final   Salmonella species NOT DETECTED NOT DETECTED Final   Yersinia enterocolitica NOT DETECTED NOT DETECTED Final   Vibrio species NOT DETECTED NOT DETECTED Final   Vibrio cholerae NOT DETECTED NOT DETECTED Final   Enteroaggregative E coli (EAEC) NOT DETECTED NOT DETECTED Final   Enteropathogenic E coli (EPEC) DETECTED (A) NOT DETECTED Final    Comment: RESULT CALLED TO, READ BACK BY AND VERIFIED WITH: BRIANNA HOLT 01/14/20 AT 1602 BY ACR    Enterotoxigenic E coli (ETEC) DETECTED (A) NOT DETECTED Final    Comment: RESULT CALLED TO, READ BACK BY AND VERIFIED WITH: BRIANNA HOLT 01/14/20 AT 1602 BY ACR    Shiga like toxin producing E coli (STEC) NOT DETECTED NOT DETECTED Final   Shigella/Enteroinvasive E coli (EIEC) NOT DETECTED NOT DETECTED Final   Cryptosporidium NOT DETECTED NOT DETECTED Final   Cyclospora cayetanensis NOT DETECTED NOT DETECTED Final   Entamoeba histolytica NOT  DETECTED NOT DETECTED Final   Giardia lamblia NOT DETECTED NOT DETECTED Final   Adenovirus F40/41 NOT DETECTED NOT DETECTED Final   Astrovirus NOT DETECTED NOT DETECTED Final   Norovirus GI/GII NOT DETECTED NOT DETECTED Final   Rotavirus A NOT DETECTED NOT DETECTED Final   Sapovirus (I, II, IV, and V) NOT DETECTED NOT DETECTED Final    Comment: Performed at [facility], [address] [place], [place] [address]  C Difficile Quick Screen w PCR reflex     Status: None   Collection Time: 01/13/20  2:47 PM   Specimen: STOOL  Result Value Ref Range  Status   C Diff antigen NEGATIVE NEGATIVE Final   C Diff toxin NEGATIVE NEGATIVE Final   C Diff interpretation No C. difficile detected.  Final    Comment: Performed at [facility], [address] [place], [place] [address]     Labs: BNP (last 3 results) No results for input(s): BNP in the last 8760 hours. Basic Metabolic Panel: Recent Labs  Lab 01/12/20 2318 01/14/20 0503  NA 140 140  K 4.0 3.5  CL 104 110  CO2 23 22  GLUCOSE 112* 79  BUN 33* 19  CREATININE 1.75* 1.72*  CALCIUM 8.9 8.7*  MG  --  2.0   Liver Function Tests: Recent Labs  Lab 01/12/20 2318  AST 28  ALT 23  ALKPHOS 100  BILITOT 0.9  PROT 7.7  ALBUMIN 4.5   Recent Labs  Lab 01/12/20 2318  LIPASE 46   No results for input(s): AMMONIA in the last 168 hours. CBC: Recent Labs  Lab 01/12/20 2210 01/14/20 0503  WBC 10.6* 4.3  NEUTROABS 9.2* 3.0  HGB 12.1* 10.1*  HCT 38.9* 32.6*  MCV 86.6 86.2  PLT 185 135*   Cardiac Enzymes: No results for input(s): CKTOTAL, CKMB, CKMBINDEX, TROPONINI in the last 168 hours. BNP: Invalid input(s): POCBNP CBG: No results for input(s): GLUCAP in the last 168 hours. D-Dimer No results for input(s): DDIMER in the last 72 hours. Hgb A1c No results for input(s): HGBA1C in the last 72 hours. Lipid Profile No results for input(s): CHOL, HDL, LDLCALC, TRIG, CHOLHDL, LDLDIRECT in the last 72 hours. Thyroid function studies No results for input(s): TSH, T4TOTAL, T3FREE, THYROIDAB in the last 72 hours.  Invalid input(s): FREET3 Anemia work up No results for input(s): VITAMINB12, FOLATE, FERRITIN, TIBC, IRON, RETICCTPCT in the last 72 hours. Urinalysis    Component Value Date/Time   COLORURINE YELLOW 01/13/2020 0327   APPEARANCEUR CLEAR 01/13/2020 0327   LABSPEC 1.010 01/13/2020 0327   PHURINE 5.5 01/13/2020 0327   GLUCOSEU NEGATIVE 01/13/2020 0327   HGBUR SMALL (A) 01/13/2020 0327   BILIRUBINUR NEGATIVE 01/13/2020 0327   KETONESUR  NEGATIVE 01/13/2020 0327   PROTEINUR NEGATIVE 01/13/2020 0327   UROBILINOGEN 0.2 05/02/2010 1420   NITRITE NEGATIVE 01/13/2020 0327   LEUKOCYTESUR NEGATIVE 01/13/2020 0327   Sepsis Labs Invalid input(s): PROCALCITONIN,  WBC,  LACTICIDVEN Microbiology Recent Results (from the past 240 hour(s))  SARS Coronavirus 2 by RT PCR (hospital order, performed in [facility] hospital lab) Nasopharyngeal Nasopharyngeal Swab     Status: None   Collection Time: 01/12/20 11:32 PM   Specimen: Nasopharyngeal Swab  Result Value Ref Range Status   SARS Coronavirus 2 NEGATIVE NEGATIVE Final    Comment: (NOTE) SARS-CoV-2 target nucleic acids are NOT DETECTED.  The SARS-CoV-2 RNA is generally detectable in upper and lower respiratory specimens during the acute phase of infection. The lowest  concentration of SARS-CoV-2 viral copies this assay can detect is 250 copies / mL. A negative result does not preclude SARS-CoV-2 infection and should not be used as the sole basis for treatment or other patient management decisions.  A negative result may occur with improper specimen collection / handling, submission of specimen other than nasopharyngeal swab, presence of viral mutation(s) within the areas targeted by this assay, and inadequate number of viral copies (<250 copies / mL). A negative result must be combined with clinical observations, patient history, and epidemiological information.  Fact Sheet for Patients:   BoilerBrush.com.cy  Fact Sheet for Healthcare Providers: https://pope.com/  This test is not yet approved or  cleared by the [place] FDA and has been authorized for detection and/or diagnosis of SARS-CoV-2 by FDA under an Emergency Use Authorization (EUA).  This EUA will remain in effect (meaning this test can be used) for the duration of the COVID-19 declaration under Section 564(b)(1) of the Act, 21 U.S.C. section 360bbb-3(b)(1), unless  the authorization is terminated or revoked sooner.  Performed at [facility], [address] [place], [place] [address]   Gastrointestinal Panel by PCR , Stool     Status: Abnormal   Collection Time: 01/13/20  2:47 PM   Specimen: STOOL  Result Value Ref Range Status   Campylobacter species NOT DETECTED NOT DETECTED Final   Plesimonas shigelloides NOT DETECTED NOT DETECTED Final   Salmonella species NOT DETECTED NOT DETECTED Final   Yersinia enterocolitica NOT DETECTED NOT DETECTED Final   Vibrio species NOT DETECTED NOT DETECTED Final   Vibrio cholerae NOT DETECTED NOT DETECTED Final   Enteroaggregative E coli (EAEC) NOT DETECTED NOT DETECTED Final   Enteropathogenic E coli (EPEC) DETECTED (A) NOT DETECTED Final    Comment: RESULT CALLED TO, READ BACK BY AND VERIFIED WITH: BRIANNA HOLT 01/14/20 AT 1602 BY ACR    Enterotoxigenic E coli (ETEC) DETECTED (A) NOT DETECTED Final    Comment: RESULT CALLED TO, READ BACK BY AND VERIFIED WITH: BRIANNA HOLT 01/14/20 AT 1602 BY ACR    Shiga like toxin producing E coli (STEC) NOT DETECTED NOT DETECTED Final   Shigella/Enteroinvasive E coli (EIEC) NOT DETECTED NOT DETECTED Final   Cryptosporidium NOT DETECTED NOT DETECTED Final   Cyclospora cayetanensis NOT DETECTED NOT DETECTED Final   Entamoeba histolytica NOT DETECTED NOT DETECTED Final   Giardia lamblia NOT DETECTED NOT DETECTED Final   Adenovirus F40/41 NOT DETECTED NOT DETECTED Final   Astrovirus NOT DETECTED NOT DETECTED Final   Norovirus GI/GII NOT DETECTED NOT DETECTED Final   Rotavirus A NOT DETECTED NOT DETECTED Final   Sapovirus (I, II, IV, and V) NOT DETECTED NOT DETECTED Final    Comment: Performed at [facility], [address] [place], [place] [address]  C Difficile Quick Screen w PCR reflex     Status: None   Collection Time: 01/13/20  2:47 PM   Specimen: STOOL  Result Value Ref Range Status   C Diff antigen NEGATIVE NEGATIVE Final   C Diff toxin  NEGATIVE NEGATIVE Final   C Diff interpretation No C. difficile detected.  Final    Comment: Performed at [facility], [address] [place], [place] [address]     Time coordinating discharge: Over 30 minutes    Lewie Chamber, MD  [facility] 01/15/2020, 4:33 PM Pager: Secure chat  If 7PM-7AM, please contact night-coverage [email or web address] Password [email or web address]

## 2021-05-10 IMAGING — DX DG ABDOMEN ACUTE W/ 1V CHEST
3 series · 3 of 3 positions shown · non-contrast
Comparison: 07/06/2017

CLINICAL DATA: Chest and abdominal pain with nausea and vomiting

EXAM:
DG ABDOMEN ACUTE W/ 1V CHEST

[chest pa]
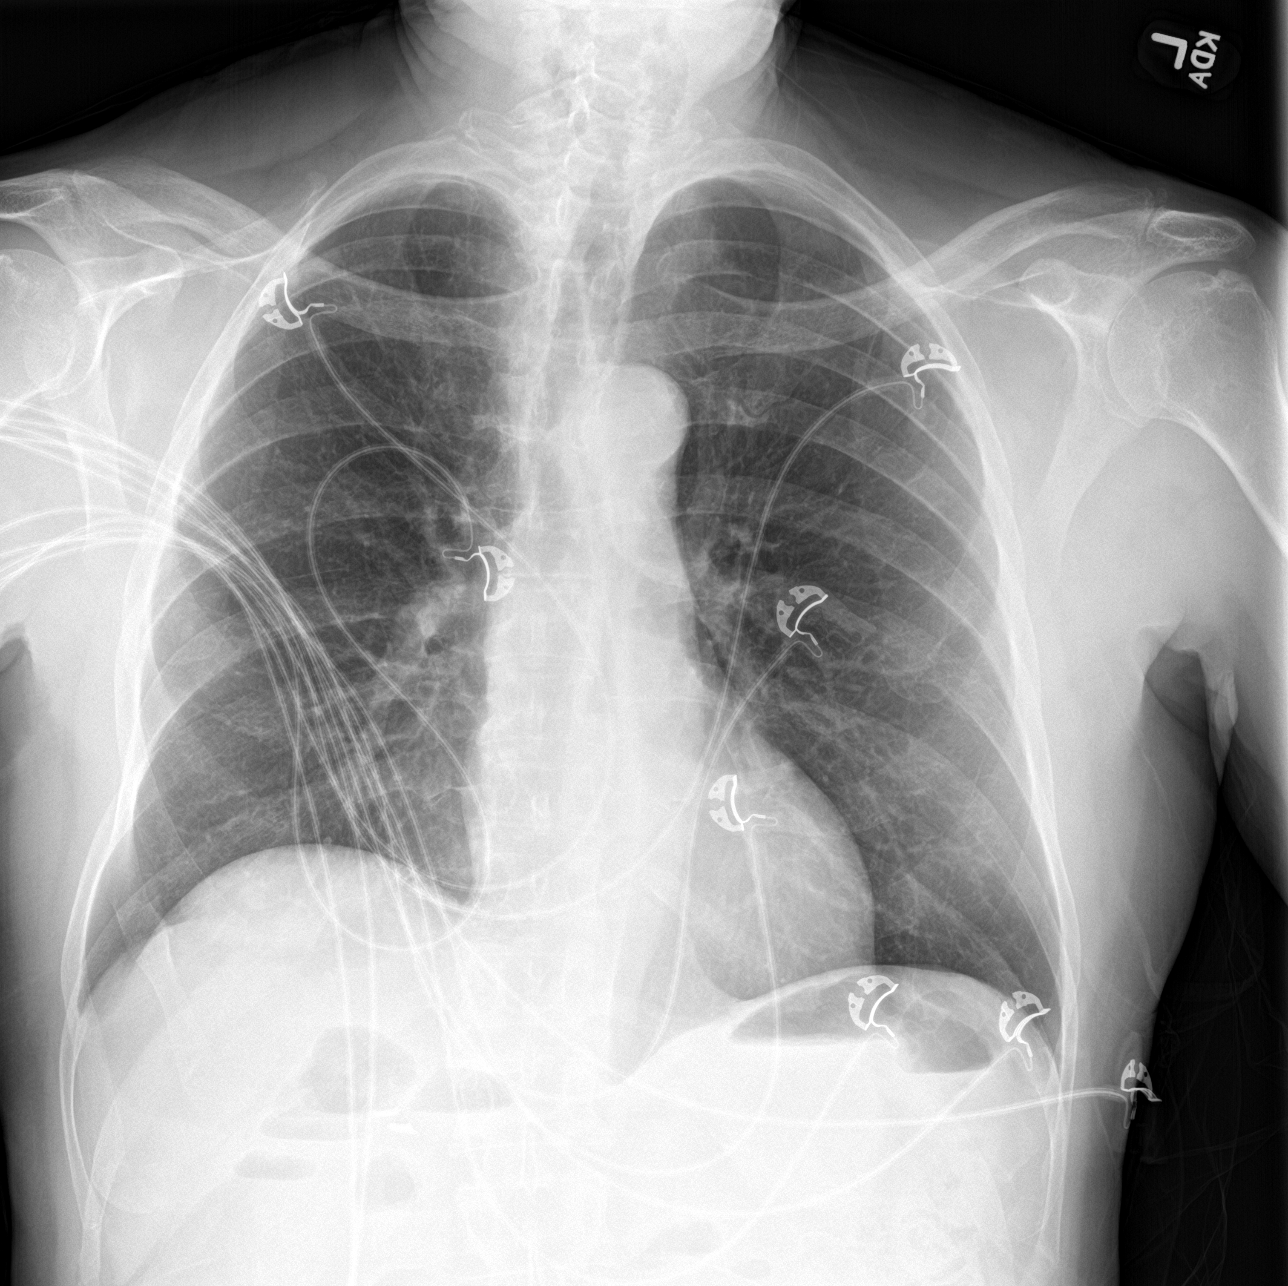

[abdomen erect]
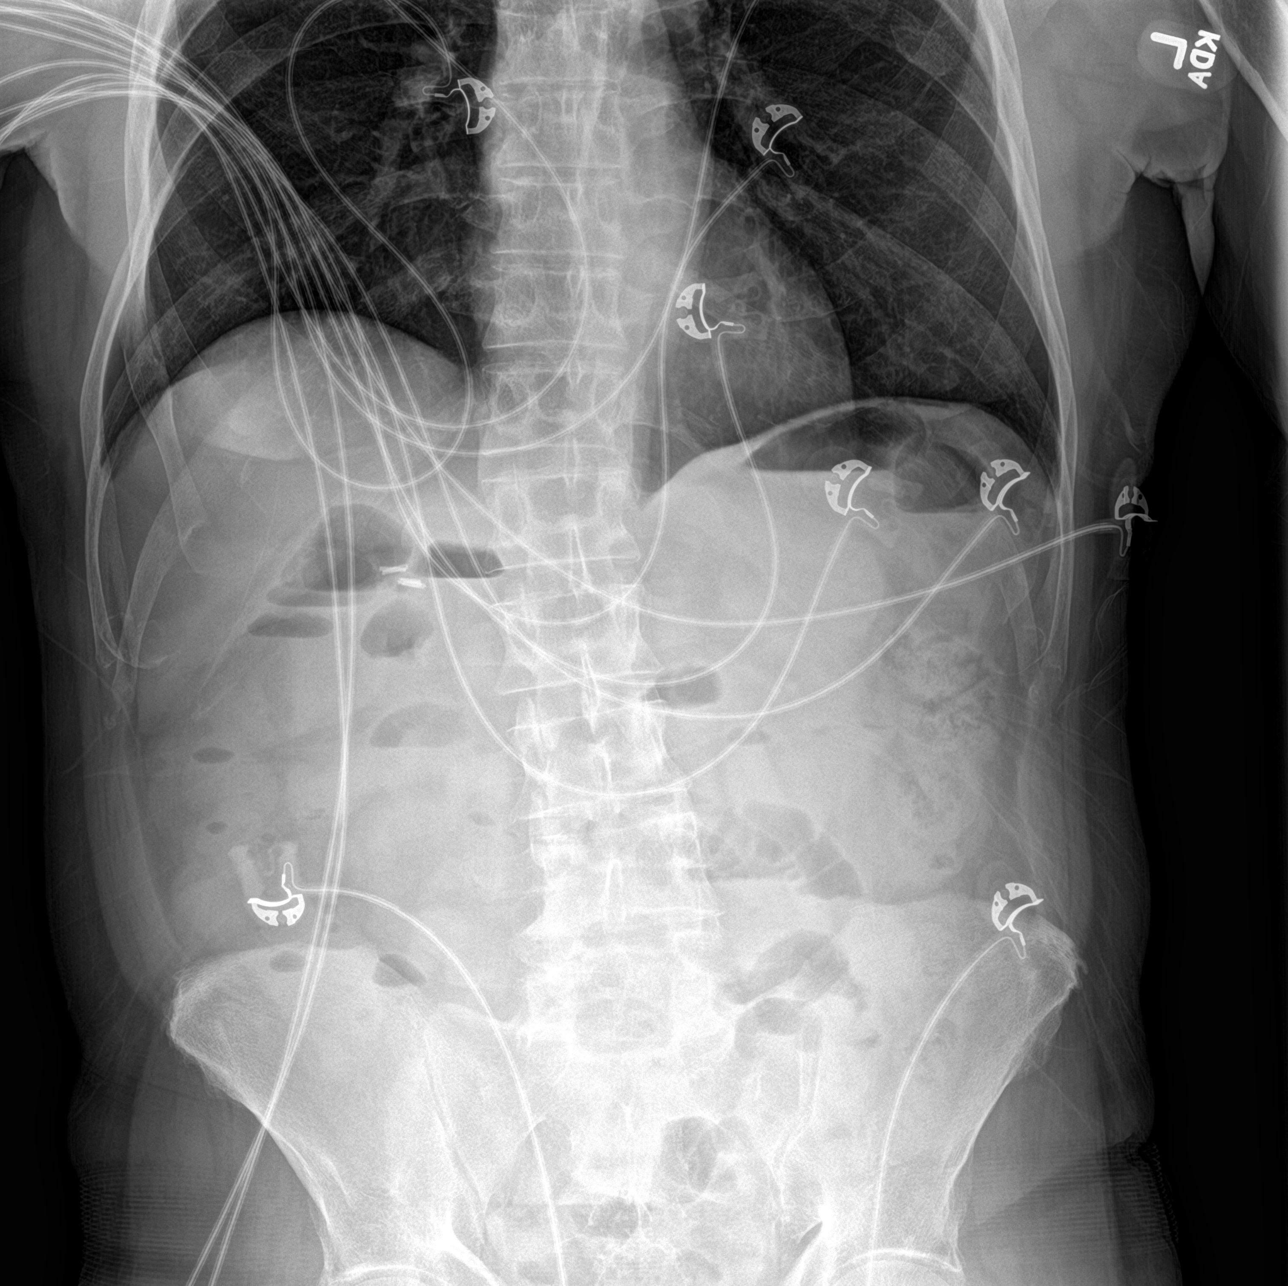

[abdomen supine]
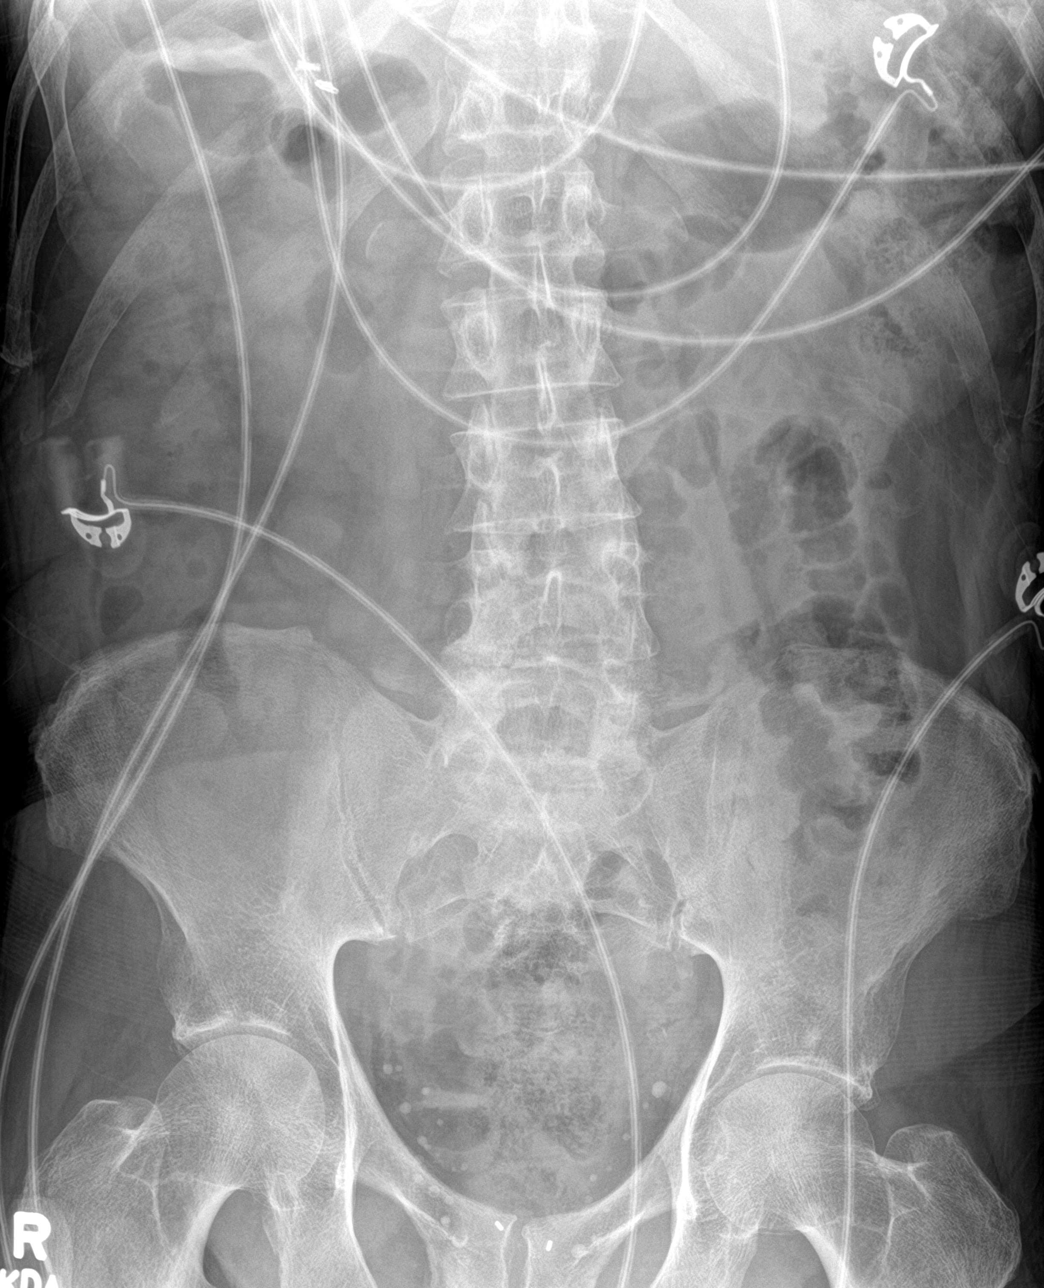

[3 of 3 positions shown; findings below may reference images not displayed]

FINDINGS: Cardiac shadow is within normal limits. The lungs are well aerated
bilaterally. No focal infiltrate or sizable effusion is seen. No
bony abnormality is noted.

Scattered large and small bowel gas is noted. Mild small bowel
dilatation is noted suggestive of partial small bowel obstruction.
No free air is noted. No abnormal mass is noted. No bony abnormality
is seen.
IMPRESSION: Small-bowel dilatation suggestive of partial small bowel
obstruction.

## 2023-12-28 ENCOUNTER — Telehealth: Payer: Self-pay | Admitting: Urology

## 2023-12-28 NOTE — Telephone Encounter (Signed)
 Patient wants to start seeing Dr Willye Harvey. According to him he is being treated for Urology currently at [facility]. Informed the patient that we need records prior to getting him scheduled. Patient said he would call up to [facility] and let them know.

## 2023-12-28 NOTE — Telephone Encounter (Signed)
 [facility] - Dr. Tommy Frames - is the doctor. We need a referral is he was seen at an urgent care facility. In case patient calls back. And the notes and labs from that visit.

## 2024-03-07 ENCOUNTER — Emergency Department (HOSPITAL_BASED_OUTPATIENT_CLINIC_OR_DEPARTMENT_OTHER)

## 2024-03-07 ENCOUNTER — Emergency Department (HOSPITAL_BASED_OUTPATIENT_CLINIC_OR_DEPARTMENT_OTHER)
Admission: EM | Admit: 2024-03-07 | Discharge: 2024-03-07 | Disposition: A | Discharge status: Home / Self Care | Attending: Emergency Medicine | Admitting: Emergency Medicine

## 2024-03-07 ENCOUNTER — Other Ambulatory Visit: Payer: Self-pay

## 2024-03-07 ENCOUNTER — Encounter (HOSPITAL_BASED_OUTPATIENT_CLINIC_OR_DEPARTMENT_OTHER): Payer: Self-pay | Admitting: Emergency Medicine

## 2024-03-07 DIAGNOSIS — R079 Chest pain, unspecified: Secondary | ICD-10-CM

## 2024-03-07 DIAGNOSIS — I10 Essential (primary) hypertension: Secondary | ICD-10-CM | POA: Insufficient documentation

## 2024-03-07 DIAGNOSIS — Z79899 Other long term (current) drug therapy: Secondary | ICD-10-CM | POA: Diagnosis not present

## 2024-03-07 DIAGNOSIS — I159 Secondary hypertension, unspecified: Secondary | ICD-10-CM | POA: Insufficient documentation

## 2024-03-07 LAB — CBC
HCT: 32.6 % — ABNORMAL LOW (ref 39.0–52.0)
Hemoglobin: 10.4 g/dL — ABNORMAL LOW (ref 13.0–17.0)
MCH: 26 pg (ref 26.0–34.0)
MCHC: 31.9 g/dL (ref 30.0–36.0)
MCV: 81.5 fL (ref 80.0–100.0)
Platelets: 196 K/uL (ref 150–400)
RBC: 4 MIL/uL — ABNORMAL LOW (ref 4.22–5.81)
RDW: 16.3 % — ABNORMAL HIGH (ref 11.5–15.5)
WBC: 4.4 K/uL (ref 4.0–10.5)
nRBC: 0 % (ref 0.0–0.2)

## 2024-03-07 LAB — TROPONIN T, HIGH SENSITIVITY: Troponin T High Sensitivity: <15 ng/L (ref 0–19)

## 2024-03-07 LAB — BASIC METABOLIC PANEL WITH GFR
Anion gap: 12 (ref 5–15)
BUN: 30 mg/dL — ABNORMAL HIGH (ref 8–23)
CO2: 22 mmol/L (ref 22–32)
Calcium: 9.3 mg/dL (ref 8.9–10.3)
Chloride: 108 mmol/L (ref 98–111)
Creatinine, Ser: 1.86 mg/dL — ABNORMAL HIGH (ref 0.61–1.24)
GFR, Estimated: 35 mL/min — ABNORMAL LOW (ref >60)
Glucose, Bld: 150 mg/dL — ABNORMAL HIGH (ref 70–99)
Potassium: 4.3 mmol/L (ref 3.5–5.1)
Sodium: 142 mmol/L (ref 135–145)

## 2024-03-07 MED ORDER — ATORVASTATIN CALCIUM 20 MG PO TABS: 20.0000 mg | ORAL_TABLET | Freq: Every day | ORAL | 1 refills | Status: AC

## 2024-03-07 MED ORDER — AMLODIPINE BESYLATE 10 MG PO TABS: 10.0000 mg | ORAL_TABLET | Freq: Every day | ORAL | 1 refills | Status: AC

## 2024-03-07 MED ORDER — AMLODIPINE BESYLATE 5 MG PO TABS
10.0000 mg | ORAL_TABLET | Freq: Once | ORAL | Status: AC
  Administered 2024-03-07: 10 mg via ORAL
  Filled 2024-03-07: qty 2

## 2024-03-07 NOTE — Discharge Instructions (Signed)
 You were seen in the emergency department for chest pain and high blood pressure Your blood pressure has been high because you have not taken your medications in the last 2 weeks We have called in a refill of 2 different medications The amlodipine  for your high blood pressure The atorvastatin  is for your high cholesterol We have called these refills into [place] [facility] on [address] in [facility] You can pick these medications up tomorrow You need to follow-up with your primary care doctor to discuss management of your high blood pressure and high cholesterol Is important that you take your daily medications Your EKG and blood work did not show any evidence of heart attack Your kidney function is up a little bit from your last and I want you to talk to your doctor about this Return to the emergency room for chest pain trouble breathing or any other concerns

## 2024-03-07 NOTE — ED Triage Notes (Signed)
 Pt reports CP x 2d, states he has not been taking his BP meds x 2-3 weeks because he is out of refills  Reports back pain and weakness, denies ShoB, diaphoresis, n/v

## 2024-03-07 NOTE — ED Provider Notes (Signed)
 [facility] Provider Note   CSN: [id number] Arrival date & time: 03/07/24  1514     Patient presents with: Chest Pain   Bradley Zavala is a 83 y.o. male.  With a history of hypertension hyperlipidemia presented to ED for chest pain.  Patient reported some chest pain that started 2 days ago.  It came on gradually while at rest and seems to come and go randomly.  No active chest pain now.  No shortness of breath nausea vomiting diaphoresis fevers chills recent illness.  He notes that he has been out of his home medications amlodipine  and atorvastatin  for the last 3 weeks as he does not have refills.    Chest Pain      Prior to Admission medications   Medication Sig Start Date End Date Taking? Authorizing Provider  amLODipine  (NORVASC ) 10 MG tablet Take 1 tablet (10 mg total) by mouth daily. 03/07/24   Pamella Ozell LABOR, DO  atorvastatin  (LIPITOR) 20 MG tablet Take 1 tablet (20 mg total) by mouth at bedtime. 03/07/24   Pamella Ozell LABOR, DO  metoprolol  tartrate (LOPRESSOR ) 50 MG tablet Take 50 mg by mouth 2 (two) times daily. 09/16/19   [provider]    Allergies: Patient has no known allergies.    Review of Systems  Cardiovascular:  Positive for chest pain.    Updated Vital Signs BP (!) 179/97   Pulse 67   Temp 98.7 F (37.1 C) (Oral)   Resp 15   Ht 5' 8 (1.727 m)   Wt 77.1 kg   SpO2 100%   BMI 25.85 kg/m   Physical Exam Vitals and nursing note reviewed.  HENT:     Head: Normocephalic and atraumatic.  Eyes:     Pupils: Pupils are equal, round, and reactive to light.  Cardiovascular:     Rate and Rhythm: Normal rate and regular rhythm.  Pulmonary:     Effort: Pulmonary effort is normal.     Breath sounds: Normal breath sounds.  Abdominal:     Palpations: Abdomen is soft.     Tenderness: There is no abdominal tenderness.  Skin:    General: Skin is warm and dry.  Neurological:     Mental Status: He is  alert.  Psychiatric:        Mood and Affect: Mood normal.     (all labs ordered are listed, but only abnormal results are displayed) Labs Reviewed  BASIC METABOLIC PANEL WITH GFR - Abnormal; Notable for the following components:      Result Value   Glucose, Bld 150 (*)    BUN 30 (*)    Creatinine, Ser 1.86 (*)    GFR, Estimated 35 (*)    All other components within normal limits  CBC - Abnormal; Notable for the following components:   RBC 4.00 (*)    Hemoglobin 10.4 (*)    HCT 32.6 (*)    RDW 16.3 (*)    All other components within normal limits  TROPONIN T, HIGH SENSITIVITY    EKG: EKG Interpretation Date/Time:  Friday March 07 2024 15:24:04 EDT Ventricular Rate:  88 PR Interval:  191 QRS Duration:  82 QT Interval:  391 QTC Calculation: 474 R Axis:   -42  Text Interpretation: Sinus rhythm Abnormal R-wave progression, early transition Left ventricular hypertrophy Anterior Q waves, possibly due to LVH Confirmed by Pamella Ozell [phone]) on 03/07/2024 5:21:38 PM  Radiology: DG Chest 2 View Result Date:  03/07/2024 CLINICAL DATA:  Chest pain. EXAM: CHEST - 2 VIEW COMPARISON:  July 06, 2017. FINDINGS: The heart size and mediastinal contours are within normal limits. Both lungs are clear. The visualized skeletal structures are unremarkable. IMPRESSION: No active cardiopulmonary disease. Electronically Signed   By: Lynwood Landy Raddle M.D.   On: 03/07/2024 16:10     Procedures   Medications Ordered in the ED  amLODipine  (NORVASC ) tablet 10 mg (10 mg Oral Given 03/07/24 1748)                                    Medical Decision Making 83 year old male with history as above is admitted to ED for chest pain intermittently x 2 to 3 days and hypertension.  Out of his medications atorvastatin  and amlodipine .  No chest pain right now.  No ischemic changes on EKG.  First troponin is negative.  Low special for ACS.  Creatinine is up a little bit from baseline but low suspicion for  significant AKI.  Rest of his labs look okay.  Will call in refills of amlodipine  and atorvastatin  instruct for PCP follow-up.  Stable for discharge.  Amount and/or Complexity of Data Reviewed Labs: ordered. Radiology: ordered.  Risk Prescription drug management.        Final diagnoses:  Secondary hypertension  Chest pain, unspecified type    ED Discharge Orders          Ordered    amLODipine  (NORVASC ) 10 MG tablet  Daily        03/07/24 1740    atorvastatin  (LIPITOR) 20 MG tablet  Daily at bedtime        03/07/24 1740               Pamella Ozell LABOR, DO 03/07/24 1750
